# Patient Record
Sex: Female | Born: 1950
Health system: Southern US, Community
[De-identification: ages and names within clinical notes are randomized; demographics above are authoritative.]

## PROBLEM LIST (undated history)

## (undated) DIAGNOSIS — M8580 Other specified disorders of bone density and structure, unspecified site: Secondary | ICD-10-CM

## (undated) DIAGNOSIS — R011 Cardiac murmur, unspecified: Secondary | ICD-10-CM

## (undated) HISTORY — PX: CARDIAC SURGERY: SHX584

## (undated) HISTORY — PX: ANKLE SURGERY: SHX546

## (undated) HISTORY — PX: CHOLECYSTECTOMY: SHX55

## (undated) HISTORY — DX: Other specified disorders of bone density and structure, unspecified site: M85.80

---

## 2000-01-30 ENCOUNTER — Encounter: Admission: RE | Admit: 2000-01-30 | Discharge: 2000-04-29 | Payer: Self-pay | Admitting: Internal Medicine

## 2000-07-16 ENCOUNTER — Other Ambulatory Visit: Admission: RE | Admit: 2000-07-16 | Discharge: 2000-07-16 | Payer: Self-pay | Admitting: Internal Medicine

## 2000-07-30 ENCOUNTER — Encounter: Payer: Self-pay | Admitting: Internal Medicine

## 2000-07-30 ENCOUNTER — Encounter: Admission: RE | Admit: 2000-07-30 | Discharge: 2000-07-30 | Payer: Self-pay | Admitting: Internal Medicine

## 2001-08-07 ENCOUNTER — Other Ambulatory Visit: Admission: RE | Admit: 2001-08-07 | Discharge: 2001-08-07 | Payer: Self-pay | Admitting: Internal Medicine

## 2004-10-24 ENCOUNTER — Other Ambulatory Visit: Admission: RE | Admit: 2004-10-24 | Discharge: 2004-10-24 | Payer: Self-pay | Admitting: Internal Medicine

## 2004-12-27 ENCOUNTER — Encounter (INDEPENDENT_AMBULATORY_CARE_PROVIDER_SITE_OTHER): Payer: Self-pay | Admitting: Specialist

## 2004-12-27 ENCOUNTER — Ambulatory Visit (HOSPITAL_COMMUNITY): Admission: RE | Admit: 2004-12-27 | Discharge: 2004-12-27 | Payer: Self-pay | Admitting: *Deleted

## 2006-01-29 ENCOUNTER — Other Ambulatory Visit: Admission: RE | Admit: 2006-01-29 | Discharge: 2006-01-29 | Payer: Self-pay | Admitting: Internal Medicine

## 2007-04-10 ENCOUNTER — Other Ambulatory Visit: Admission: RE | Admit: 2007-04-10 | Discharge: 2007-04-10 | Payer: Self-pay | Admitting: Internal Medicine

## 2008-04-23 ENCOUNTER — Other Ambulatory Visit: Admission: RE | Admit: 2008-04-23 | Discharge: 2008-04-23 | Payer: Self-pay | Admitting: Internal Medicine

## 2009-06-20 ENCOUNTER — Other Ambulatory Visit: Admission: RE | Admit: 2009-06-20 | Discharge: 2009-06-20 | Payer: Self-pay | Admitting: Internal Medicine

## 2010-06-12 ENCOUNTER — Other Ambulatory Visit: Admission: RE | Admit: 2010-06-12 | Discharge: 2010-06-12 | Payer: Self-pay | Admitting: Internal Medicine

## 2011-05-11 NOTE — Op Note (Signed)
Karla Mullen, Karla Mullen                 ACCOUNT NO.:  [id number]   MEDICAL RECORD NO.:  [id number]          PATIENT TYPE:  AMB   LOCATION:  ENDO                         FACILITY:  [facility]   PHYSICIAN:  Georgiana Spinner, M.D.    DATE OF BIRTH:  [birth year]   DATE OF PROCEDURE:  DATE OF DISCHARGE:                                 OPERATIVE REPORT   PROCEDURE:  Upper endoscopy.   INDICATIONS:  GERD.   ANESTHESIA:  Demerol 50, versed 5 mg.   PROCEDURE:  With the patient mildly sedated and in the left lateral  decubitus position, the Olympus videoscopic endoscope was inserted into the  mouth and passed under direct vision through the esophagus, which appeared  normal except there was a questionable area of Barrett's photographed and  subsequently biopsied.  We entered the stomach.  The fundus, body, antrum,  duodenal bulb, and second portion of the duodenum were visualized.  From  this point, the endoscope was slowly withdrawn, taking circumferential views  of the duodenal mucosa until the endoscope had been pulled back into the  stomach.  It was placed in retroflexion to view the stomach from below.  The  endoscope was straightened and withdrawn, taking circumferential views of  the gastric and esophageal mucosa.  The patient's vital signs and pulse  oximetry remained stable.  The patient tolerated the procedure well without  apparent complications.   FINDINGS:  Question of Barrett's esophagus, biopsied.   RECOMMENDATIONS:  Await biopsy report.  The patient will call me for results  and follow up with me as an outpatient.  Proceed to colonoscopy as planned.      GMO/MEDQ  D:  12/27/2004  T:  12/27/2004  Job:  [id number]

## 2011-05-11 NOTE — Op Note (Signed)
Karla Mullen, CONSUEGRA                 ACCOUNT NO.:  [id number]   MEDICAL RECORD NO.:  [id number]          PATIENT TYPE:  AMB   LOCATION:  ENDO                         FACILITY:  [facility]   PHYSICIAN:  Georgiana Spinner, M.D.    DATE OF BIRTH:  09-06-1951   DATE OF PROCEDURE:  12/27/2004  DATE OF DISCHARGE:                                 OPERATIVE REPORT   PROCEDURE:  Colonoscopy.   INDICATIONS:  Colon cancer screening.   ANESTHESIA:  Demerol 10 mg, Versed 2 mg.   PROCEDURE:  With the patient mildly sedated in the left lateral decubitus  position, the Olympus videoscopic colonoscope was inserted in the rectum and  passed under direct vision.  With pressure applied to the abdomen and the  patient rolled into various positions, we were able to reach the cecum,  identified by ileocecal valve and base of cecum, both of which were  photographed.  From this point the colonoscope was slowly withdrawn, taking  circumferential views of the colonic mucosa, stopping only in the rectum,  which appeared normal on direct and retroflex view.  The endoscope was  straightened and withdrawn.  The patient's vital signs and pulse oximetry  remained stable.  The patient tolerated the procedure well without apparent  complications.   FINDINGS:  A rather unremarkable examination.   PLAN:  See endoscopy note with further details.      GMO/MEDQ  D:  12/27/2004  T:  12/27/2004  Job:  [id number]

## 2012-04-02 ENCOUNTER — Emergency Department (HOSPITAL_COMMUNITY)
Admission: EM | Admit: 2012-04-02 | Discharge: 2012-04-03 | Disposition: A | Discharge status: Home / Self Care | Payer: BC Managed Care – PPO | Attending: Emergency Medicine | Admitting: Emergency Medicine

## 2012-04-02 ENCOUNTER — Encounter (HOSPITAL_COMMUNITY): Payer: Self-pay | Admitting: *Deleted

## 2012-04-02 DIAGNOSIS — R011 Cardiac murmur, unspecified: Secondary | ICD-10-CM | POA: Insufficient documentation

## 2012-04-02 DIAGNOSIS — E119 Type 2 diabetes mellitus without complications: Secondary | ICD-10-CM | POA: Insufficient documentation

## 2012-04-02 DIAGNOSIS — B070 Plantar wart: Secondary | ICD-10-CM | POA: Insufficient documentation

## 2012-04-02 HISTORY — DX: Cardiac murmur, unspecified: R01.1

## 2012-04-02 NOTE — ED Notes (Signed)
Pt reports having an ulcer on her (R) foot.  Cannot be seen podiatric on the 17th but it unable to tolerate the pain.  Pt noted to have an ulcer that is not weeping, on the bottom of her (R) foot.  Pt reports increased pain with ambulation.

## 2012-04-03 MED ORDER — KETOROLAC TROMETHAMINE 60 MG/2ML IM SOLN
60.0000 mg | Freq: Once | INTRAMUSCULAR | Status: AC
  Administered 2012-04-03: 60 mg via INTRAMUSCULAR
  Filled 2012-04-03: qty 2

## 2012-04-03 MED ORDER — NAPROXEN 500 MG PO TABS: 500.0000 mg | ORAL_TABLET | Freq: Two times a day (BID) | ORAL | Status: AC

## 2012-04-03 MED ORDER — TRAMADOL HCL 50 MG PO TABS: 50.0000 mg | ORAL_TABLET | Freq: Four times a day (QID) | ORAL | Status: AC | PRN

## 2012-04-03 NOTE — ED Notes (Signed)
Pt ambulated with a steady gait; VSS; A&Ox3; no signs of distress; respirations even and unlabored; no questions at this time.  

## 2012-04-03 NOTE — ED Provider Notes (Signed)
History     CSN: [id number]  Arrival date & time 04/02/12  2059   First MD Initiated Contact with Patient 04/03/12 0120      Chief Complaint  Patient presents with  . Foot Ulcer    (Consider location/radiation/quality/duration/timing/severity/associated sxs/prior treatment) HPI Comments: 61 year old female with a history of diabetes and prior foot ulcer presents with a lesion on the bottom of her right foot has been present for approximately 2 weeks. It is gradually getting worse, tender to walk and not associated with fevers open wounds or draining.  She has not seen a physician or a podiatrist but does have an appointment in 6 days with a podiatrist.  The history is provided by the patient.    Past Medical History  Diagnosis Date  . Diabetes mellitus   . Heart murmur     Past Surgical History  Procedure Date  . Cardiac surgery     had at 61 years old  . Cholecystectomy     History reviewed. No pertinent family history.  History  Substance Use Topics  . Smoking status: Never Smoker   . Smokeless tobacco: Not on file  . Alcohol Use: No    OB History    Grav Para Term Preterm Abortions TAB SAB Ect Mult Living                  Review of Systems  Constitutional: Negative for fever.  Skin:       Lesion to the bottom of the right foot    Allergies  Review of patient's allergies indicates no known allergies.  Home Medications   Current Outpatient Rx  Name Route Sig Dispense Refill  . ASPIRIN EC 81 MG PO TBEC Oral Take 81 mg by mouth daily.    . IBUPROFEN 200 MG PO TABS Oral Take 400 mg by mouth every 6 (six) hours as needed. For pain    . METFORMIN HCL 500 MG PO TABS Oral Take 1,000 mg by mouth 2 (two) times daily with a meal.    . ADULT MULTIVITAMIN W/MINERALS CH Oral Take 1 tablet by mouth daily.    Marland Kitchen NAPROXEN 500 MG PO TABS Oral Take 1 tablet (500 mg total) by mouth 2 (two) times daily with a meal. 30 tablet 0  . TRAMADOL HCL 50 MG PO TABS Oral Take 1  tablet (50 mg total) by mouth every 6 (six) hours as needed for pain. 15 tablet 0    BP 122/64  Pulse 60  Temp(Src) 98.5 F (36.9 C) (Oral)  Resp 18  SpO2 99%  Physical Exam  Nursing note and vitals reviewed. Constitutional: She appears well-developed and well-nourished. No distress.  HENT:  Head: Normocephalic and atraumatic.  Eyes: Conjunctivae are normal. No scleral icterus.  Cardiovascular: Normal rate, regular rhythm and intact distal pulses.   Pulmonary/Chest: Effort normal and breath sounds normal.  Musculoskeletal: She exhibits tenderness ( On the bottom of the right foot). She exhibits no edema.  Neurological: She is alert.  Skin: Skin is warm and dry. No rash noted. She is not diaphoretic.       Thickened skin on the bottom of the right foot, mild tenderness to palpation this is over the ball of the foot. It appears to be consistent with a thickened callus or plantars wart.  There is no bleeding or drainage and no open wound    ED Course  Procedures (including critical care time)  Labs Reviewed - No data to display No  results found.   1. Plantar wart       MDM  Well appearing, likely plantars wart versus a callus to the bottom of the foot. Can followup with podiatry safely, intramuscular Toradol given prior to discharge        Vida Roller, MD 04/03/12 0130

## 2012-04-03 NOTE — ED Notes (Signed)
MD at bedside. 

## 2012-04-03 NOTE — Discharge Instructions (Signed)
Plantar Warts Plantar warts are growths on the bottom of your foot. Warts are caused by a germ.  HOME CARE  Soak your foot in warm water. Dry your foot when you are done. Remove the top layer of softened skin, then apply any medicine as told by your doctor.   Remove any bandages daily. File off extra wart tissue. Repeat this as told by your doctor until the wart goes away.   Only use medicine as told by your doctor.   Use a bandage with a hole in it (doughnut bandage) to relieve pain.Put the hole over the wart.   Wear shoes and socks and change them daily.   Keep your foot clean and dry.   Check your feet regularly.   Avoid contact with warts on other people.   Have your warts checked by your doctor.  GET HELP RIGHT AWAY IF: The treated skin becomes red, puffy (swollen), or painful. MAKE SURE YOU:  Understand these instructions.   Will watch your condition.   Will get help right away if you are not doing well or get worse.  Document Released: 01/12/2011 Document Revised: 11/29/2011 Document Reviewed: 01/12/2011 [employer] Patient Information 2012 [employer]. 

## 2013-07-30 ENCOUNTER — Telehealth (HOSPITAL_COMMUNITY): Payer: Self-pay | Admitting: Internal Medicine

## 2013-08-04 ENCOUNTER — Telehealth (HOSPITAL_COMMUNITY): Payer: Self-pay | Admitting: Internal Medicine

## 2013-08-04 NOTE — Telephone Encounter (Signed)
Left message for patient to call back/needs to schedule testing ordered by Dr. Selena Batten

## 2013-08-12 ENCOUNTER — Telehealth (HOSPITAL_COMMUNITY): Payer: Self-pay | Admitting: Internal Medicine

## 2013-08-17 ENCOUNTER — Telehealth (HOSPITAL_COMMUNITY): Payer: Self-pay | Admitting: Internal Medicine

## 2013-08-17 NOTE — Telephone Encounter (Signed)
Sent letter to ordering physician regarding testing not being scheduled due to patient not responding to phone calls or letters. 

## 2013-09-20 ENCOUNTER — Emergency Department (HOSPITAL_COMMUNITY): Payer: Worker's Compensation

## 2013-09-20 ENCOUNTER — Emergency Department (HOSPITAL_COMMUNITY)
Admission: EM | Admit: 2013-09-20 | Discharge: 2013-09-20 | Disposition: A | Discharge status: Home / Self Care | Payer: Worker's Compensation | Attending: Emergency Medicine | Admitting: Emergency Medicine

## 2013-09-20 DIAGNOSIS — R209 Unspecified disturbances of skin sensation: Secondary | ICD-10-CM | POA: Insufficient documentation

## 2013-09-20 DIAGNOSIS — Y9389 Activity, other specified: Secondary | ICD-10-CM | POA: Insufficient documentation

## 2013-09-20 DIAGNOSIS — S52599A Other fractures of lower end of unspecified radius, initial encounter for closed fracture: Secondary | ICD-10-CM | POA: Insufficient documentation

## 2013-09-20 DIAGNOSIS — Y9289 Other specified places as the place of occurrence of the external cause: Secondary | ICD-10-CM | POA: Insufficient documentation

## 2013-09-20 DIAGNOSIS — R011 Cardiac murmur, unspecified: Secondary | ICD-10-CM | POA: Insufficient documentation

## 2013-09-20 DIAGNOSIS — Z79899 Other long term (current) drug therapy: Secondary | ICD-10-CM | POA: Insufficient documentation

## 2013-09-20 DIAGNOSIS — E119 Type 2 diabetes mellitus without complications: Secondary | ICD-10-CM | POA: Insufficient documentation

## 2013-09-20 DIAGNOSIS — W010XXA Fall on same level from slipping, tripping and stumbling without subsequent striking against object, initial encounter: Secondary | ICD-10-CM | POA: Insufficient documentation

## 2013-09-20 DIAGNOSIS — Y990 Civilian activity done for income or pay: Secondary | ICD-10-CM | POA: Insufficient documentation

## 2013-09-20 DIAGNOSIS — Z7982 Long term (current) use of aspirin: Secondary | ICD-10-CM | POA: Insufficient documentation

## 2013-09-20 DIAGNOSIS — S52501A Unspecified fracture of the lower end of right radius, initial encounter for closed fracture: Secondary | ICD-10-CM

## 2013-09-20 MED ORDER — IBUPROFEN 800 MG PO TABS
800.0000 mg | ORAL_TABLET | Freq: Once | ORAL | Status: AC
  Administered 2013-09-20: 800 mg via ORAL
  Filled 2013-09-20: qty 1

## 2013-09-20 MED ORDER — HYDROCODONE-ACETAMINOPHEN 5-325 MG PO TABS: 1.0000 | ORAL_TABLET | Freq: Four times a day (QID) | ORAL | Status: DC | PRN

## 2013-09-20 NOTE — ED Notes (Signed)
Pt states that she slipped where a piece of meat had been dropped on the floor at work and landed on her bottom and her R hand. Now having pain in R hand. No LOC. Did not hit head.

## 2013-09-20 NOTE — ED Provider Notes (Signed)
CSN: [id number]     Arrival date & time 09/20/13  1606 History  This chart was scribed for non-physician practitioner, Johnnette Gourd, PA-C working with Nelia Shi, MD by Greggory Stallion, ED scribe. This patient was seen in room WTR5/WTR5 and the patient's care was started at 4:18 PM.   Chief Complaint  Patient presents with  . Hand Injury  . Fall   The history is provided by the patient. No language interpreter was used.    HPI Comments: Karla Mullen is a 62 y.o. female who presents to the Emergency Department complaining of right wrist injury that occurred about 3 hours ago. She now has sudden onset right wrist pain with associated numbness and swelling. Pt rates her pain 7/10. She has been using ice with little relief. Pt denies hitting her head or LOC. She denies any other associated symptoms.    Past Medical History  Diagnosis Date  . Diabetes mellitus   . Heart murmur    Past Surgical History  Procedure Laterality Date  . Cardiac surgery      had at 62 years old  . Cholecystectomy     No family history on file. History  Substance Use Topics  . Smoking status: Never Smoker   . Smokeless tobacco: Not on file  . Alcohol Use: No   OB History   Grav Para Term Preterm Abortions TAB SAB Ect Mult Living                 Review of Systems  Musculoskeletal: Positive for joint swelling and arthralgias.  Neurological: Positive for numbness. Negative for syncope.  All other systems reviewed and are negative.    Allergies  Review of patient's allergies indicates no known allergies.  Home Medications   Current Outpatient Rx  Name  Route  Sig  Dispense  Refill  . aspirin EC 81 MG tablet   Oral   Take 81 mg by mouth daily.         Marland Kitchen ibuprofen (ADVIL,MOTRIN) 200 MG tablet   Oral   Take 400 mg by mouth every 6 (six) hours as needed. For pain         . metFORMIN (GLUCOPHAGE) 500 MG tablet   Oral   Take 1,000 mg by mouth 2 (two) times daily with a meal.         .  Multiple Vitamin (MULITIVITAMIN WITH MINERALS) TABS   Oral   Take 1 tablet by mouth daily.          BP 177/80  Pulse 67  Temp(Src) 99.1 F (37.3 C) (Oral)  SpO2 100%  Physical Exam  Nursing note and vitals reviewed. Constitutional: She is oriented to person, place, and time. She appears well-developed and well-nourished. No distress.  HENT:  Head: Normocephalic and atraumatic.  Mouth/Throat: Oropharynx is clear and moist.  Eyes: Conjunctivae and EOM are normal.  Neck: Normal range of motion. Neck supple.  Cardiovascular: Normal rate, regular rhythm and normal heart sounds.   Pulmonary/Chest: Effort normal and breath sounds normal. No respiratory distress.  Musculoskeletal: Normal range of motion. She exhibits no edema.  Swelling and tenderness over her carpal bones, more so on the radial aspect of her right wrist. Full ROM of her hand. Limited ROM of her wrist due to pain. Capillary refill less than 3 seconds. Strength intact.   Neurological: She is alert and oriented to person, place, and time. No sensory deficit.  Sensation intact.   Skin: Skin is warm and  dry.  Psychiatric: She has a normal mood and affect. Her behavior is normal.    ED Course  Procedures (including critical care time)  DIAGNOSTIC STUDIES: Oxygen Saturation is 100% on RA, normal by my interpretation.    COORDINATION OF CARE: 4:20 PM-Discussed treatment plan which includes xray with pt at bedside and pt agreed to plan.   Labs Review Labs Reviewed - No data to display Imaging Review Dg Wrist Complete Right  09/20/2013   CLINICAL DATA:  Fall. Right wrist pain.  EXAM: RIGHT WRIST - COMPLETE 3+ VIEW  COMPARISON:  None.  FINDINGS: Transverse nondisplaced distal radial metaphysis heel fracture. Loss of the normal volar tilt. Distal in a appears intact. Scaphoid bone appears normal. Extension into the distal radioulnar joint. No extension into the radiocarpal joint.  IMPRESSION: Transverse nondisplaced distal  radius fracture with loss of the normal volar tilt.   Electronically Signed   By: Andreas Newport M.D.   On: 09/20/2013 16:49    MDM   1. Distal radius fracture, right, closed, initial encounter    Patient with nondisplaced distal radius fracture. Neurovascularly intact. Splint applied. She has seen [facility]opedics in the past, will followup with [facility] orthopedics. Vicodin given for severe pain. Refuses pain medication initially, at discharge requests ibuprofen. Return precautions discussed. Patient states understanding of plan and is agreeable.   I personally performed the services described in this documentation, which was scribed in my presence. The recorded information has been reviewed and is accurate.   LORALI KHAMIS, PA-C 09/20/13 1745  Trevor Mace, PA-C 09/20/13 [phone]

## 2013-09-20 NOTE — ED Notes (Signed)
Drug urine complete- pt can be discharged

## 2013-10-02 NOTE — ED Provider Notes (Signed)
Medical screening examination/treatment/procedure(s) were performed by non-physician practitioner and as supervising physician I was immediately available for consultation/collaboration.    Shlok Raz L Chadd Tollison, MD 10/02/13 0748 

## 2017-12-11 DIAGNOSIS — Z0000 Encounter for general adult medical examination without abnormal findings: Secondary | ICD-10-CM | POA: Diagnosis not present

## 2017-12-11 DIAGNOSIS — R011 Cardiac murmur, unspecified: Secondary | ICD-10-CM | POA: Diagnosis not present

## 2017-12-11 DIAGNOSIS — R05 Cough: Secondary | ICD-10-CM | POA: Diagnosis not present

## 2017-12-24 HISTORY — PX: BREAST BIOPSY: SHX20

## 2018-01-26 DIAGNOSIS — R011 Cardiac murmur, unspecified: Secondary | ICD-10-CM | POA: Insufficient documentation

## 2018-01-26 NOTE — Progress Notes (Deleted)
Cardiology Office Note    Date:  01/26/2018   ID:  Karla Mullen, DOB Jun 07, 1951, MRN [id number]  PCP:  Jani Gravel, MD  Cardiologist: Sinclair Grooms, MD   No chief complaint on file.   History of Present Illness:  Karla Mullen is a 67 y.o. female  Referred by Dr. Jani Gravel for evaluation of heart murmur.    Past Medical History:  Diagnosis Date  . Diabetes mellitus   . Heart murmur     Past Surgical History:  Procedure Laterality Date  . CARDIAC SURGERY     had at 67 years old  . CHOLECYSTECTOMY      Current Medications: Outpatient Medications Prior to Visit  Medication Sig Dispense Refill  . aspirin EC 81 MG tablet Take 81 mg by mouth daily.    Marland Kitchen HYDROcodone-acetaminophen (NORCO/VICODIN) 5-325 MG per tablet Take 1-2 tablets by mouth every 6 (six) hours as needed for pain. 15 tablet 0  . ibuprofen (ADVIL,MOTRIN) 200 MG tablet Take 400 mg by mouth every 6 (six) hours as needed. For pain    . metFORMIN (GLUCOPHAGE) 500 MG tablet Take 1,000 mg by mouth 2 (two) times daily with a meal.    . Multiple Vitamin (MULITIVITAMIN WITH MINERALS) TABS Take 1 tablet by mouth daily.     No facility-administered medications prior to visit.      Allergies:   Patient has no known allergies.   Social History   Socioeconomic History  . Marital status: Single    Spouse name: Not on file  . Number of children: Not on file  . Years of education: Not on file  . Highest education level: Not on file  Social Needs  . Financial resource strain: Not on file  . Food insecurity - worry: Not on file  . Food insecurity - inability: Not on file  . Transportation needs - medical: Not on file  . Transportation needs - non-medical: Not on file  Occupational History  . Not on file  Tobacco Use  . Smoking status: Never Smoker  Substance and Sexual Activity  . Alcohol use: No  . Drug use: No  . Sexual activity: Not on file  Other Topics Concern  . Not on file  Social History Narrative  .  Not on file     Family History:  The patient's ***family history is not on file.   ROS:   Please see the history of present illness.    ***  All other systems reviewed and are negative.   PHYSICAL EXAM:   VS:  There were no vitals taken for this visit.   GEN: Well nourished, well developed, in no acute distress  HEENT: normal  Neck: no JVD, carotid bruits, or masses Cardiac: ***RRR; no murmurs, rubs, or gallops,no edema  Respiratory:  clear to auscultation bilaterally, normal work of breathing GI: soft, nontender, nondistended, + BS MS: no deformity or atrophy  Skin: warm and dry, no rash Neuro:  Alert and Oriented x 3, Strength and sensation are intact Psych: euthymic mood, full affect  Wt Readings from Last 3 Encounters:  No data found for Wt      Studies/Labs Reviewed:   EKG:  EKG  ***  Recent Labs: No results found for requested labs within last 8760 hours.   Lipid Panel No results found for: CHOL, TRIG, HDL, CHOLHDL, VLDL, LDLCALC, LDLDIRECT  Additional studies/ records that were reviewed today include:  ***    ASSESSMENT:  1. Heart murmur      PLAN:  In order of problems listed above:  1. ***    Medication Adjustments/Labs and Tests Ordered: Current medicines are reviewed at length with the patient today.  Concerns regarding medicines are outlined above.  Medication changes, Labs and Tests ordered today are listed in the Patient Instructions below. There are no Patient Instructions on file for this visit.   Signed, Sinclair Grooms, MD  01/26/2018 8:40 AM    [facility] [address], [place], [place]  [address] Phone: [phone]; Fax: [phone]

## 2018-01-27 ENCOUNTER — Ambulatory Visit: Payer: Self-pay | Admitting: Interventional Cardiology

## 2018-02-11 ENCOUNTER — Other Ambulatory Visit: Payer: Self-pay | Admitting: Internal Medicine

## 2018-02-11 DIAGNOSIS — Z1231 Encounter for screening mammogram for malignant neoplasm of breast: Secondary | ICD-10-CM

## 2018-02-17 DIAGNOSIS — E7800 Pure hypercholesterolemia, unspecified: Secondary | ICD-10-CM | POA: Diagnosis not present

## 2018-02-17 DIAGNOSIS — I10 Essential (primary) hypertension: Secondary | ICD-10-CM | POA: Diagnosis not present

## 2018-02-17 DIAGNOSIS — N390 Urinary tract infection, site not specified: Secondary | ICD-10-CM | POA: Diagnosis not present

## 2018-02-17 DIAGNOSIS — E559 Vitamin D deficiency, unspecified: Secondary | ICD-10-CM | POA: Diagnosis not present

## 2018-02-18 ENCOUNTER — Ambulatory Visit
Admission: RE | Admit: 2018-02-18 | Discharge: 2018-02-18 | Disposition: A | Discharge status: Home / Self Care | Payer: Medicare Other | Source: Ambulatory Visit | Attending: Internal Medicine | Admitting: Internal Medicine

## 2018-02-18 DIAGNOSIS — Z1231 Encounter for screening mammogram for malignant neoplasm of breast: Secondary | ICD-10-CM | POA: Diagnosis not present

## 2018-02-20 ENCOUNTER — Other Ambulatory Visit: Payer: Self-pay | Admitting: Internal Medicine

## 2018-02-20 DIAGNOSIS — R928 Other abnormal and inconclusive findings on diagnostic imaging of breast: Secondary | ICD-10-CM

## 2018-02-21 DIAGNOSIS — M8580 Other specified disorders of bone density and structure, unspecified site: Secondary | ICD-10-CM

## 2018-02-21 HISTORY — DX: Other specified disorders of bone density and structure, unspecified site: M85.80

## 2018-02-24 ENCOUNTER — Ambulatory Visit (INDEPENDENT_AMBULATORY_CARE_PROVIDER_SITE_OTHER): Payer: Medicare Other | Admitting: Obstetrics & Gynecology

## 2018-02-24 ENCOUNTER — Encounter: Payer: Self-pay | Admitting: Obstetrics & Gynecology

## 2018-02-24 VITALS — BP 150/92 | Ht 67.0 in | Wt 217.0 lb

## 2018-02-24 DIAGNOSIS — E6609 Other obesity due to excess calories: Secondary | ICD-10-CM

## 2018-02-24 DIAGNOSIS — Z780 Asymptomatic menopausal state: Secondary | ICD-10-CM

## 2018-02-24 DIAGNOSIS — Z124 Encounter for screening for malignant neoplasm of cervix: Secondary | ICD-10-CM

## 2018-02-24 DIAGNOSIS — Z6833 Body mass index (BMI) 33.0-33.9, adult: Secondary | ICD-10-CM

## 2018-02-24 DIAGNOSIS — Z13820 Encounter for screening for osteoporosis: Secondary | ICD-10-CM

## 2018-02-24 DIAGNOSIS — Z01419 Encounter for gynecological examination (general) (routine) without abnormal findings: Secondary | ICD-10-CM

## 2018-02-24 NOTE — Patient Instructions (Signed)
1. Encounter for routine gynecological examination with Papanicolaou smear of cervix Normal gynecologic exam and menopause.  Reflex done.  Breast exam normal.  Recent screening mammogram abnormal on the left side, diagnostic left mammogram and ultrasound scheduled.  Scheduled to see Dr. Maudie Mercury shortly, just did her health labs.  Will organize colonoscopy with Dr. Maudie Mercury.  2. Menopause present Well on no hormone replacement therapy.  No postmenopausal bleeding.  Abstinent.  3. Screening for osteoporosis Vitamin D supplements, calcium rich nutrition and regular weightbearing physical activity recommended.  Will follow up here for bone density. - DG Bone Density; Future  4. Class 1 obesity due to excess calories without serious comorbidity with body mass index (BMI) of 33.0 to 33.9 in adult Low calorie/low carb diet reviewed and [employer] recommended.  Regular aerobic activity 5 times a week and weightlifting every 2 days recommended.  5. Screening for malignant neoplasm of cervix  - Pap IG w/ reflex to HPV when [facility]  Karla Mullen, it was a pleasure meeting you today!  I will inform you of your results as soon as they are available.   Health Maintenance for Postmenopausal Women Menopause is a normal process in which your reproductive ability comes to an end. This process happens gradually over a span of months to years, usually between the ages of 31 and 36. Menopause is complete when you have missed 12 consecutive menstrual periods. It is important to talk with your health care provider about some of the most common conditions that affect postmenopausal women, such as heart disease, cancer, and bone loss (osteoporosis). Adopting a healthy lifestyle and getting preventive care can help to promote your health and wellness. Those actions can also lower your chances of developing some of these common conditions. What should I know about menopause? During menopause, you may experience a number of  symptoms, such as:  Moderate-to-severe hot flashes.  Night sweats.  Decrease in sex drive.  Mood swings.  Headaches.  Tiredness.  Irritability.  Memory problems.  Insomnia.  Choosing to treat or not to treat menopausal changes is an individual decision that you make with your health care provider. What should I know about hormone replacement therapy and supplements? Hormone therapy products are effective for treating symptoms that are associated with menopause, such as hot flashes and night sweats. Hormone replacement carries certain risks, especially as you become older. If you are thinking about using estrogen or estrogen with progestin treatments, discuss the benefits and risks with your health care provider. What should I know about heart disease and stroke? Heart disease, heart attack, and stroke become more likely as you age. This may be due, in part, to the hormonal changes that your body experiences during menopause. These can affect how your body processes dietary fats, triglycerides, and cholesterol. Heart attack and stroke are both medical emergencies. There are many things that you can do to help prevent heart disease and stroke:  Have your blood pressure checked at least every 1-2 years. High blood pressure causes heart disease and increases the risk of stroke.  If you are 38-8 years old, ask your health care provider if you should take aspirin to prevent a heart attack or a stroke.  Do not use any tobacco products, including cigarettes, chewing tobacco, or electronic cigarettes. If you need help quitting, ask your health care provider.  It is important to eat a healthy diet and maintain a healthy weight. ? Be sure to include plenty of vegetables, fruits, low-fat dairy products,  and lean protein. ? Avoid eating foods that are high in solid fats, added sugars, or salt (sodium).  Get regular exercise. This is one of the most important things that you can do for your  health. ? Try to exercise for at least 150 minutes each week. The type of exercise that you do should increase your heart rate and make you sweat. This is known as moderate-intensity exercise. ? Try to do strengthening exercises at least twice each week. Do these in addition to the moderate-intensity exercise.  Know your numbers.Ask your health care provider to check your cholesterol and your blood glucose. Continue to have your blood tested as directed by your health care provider.  What should I know about cancer screening? There are several types of cancer. Take the following steps to reduce your risk and to catch any cancer development as early as possible. Breast Cancer  Practice breast self-awareness. ? This means understanding how your breasts normally appear and feel. ? It also means doing regular breast self-exams. Let your health care provider know about any changes, no matter how small.  If you are 73 or older, have a clinician do a breast exam (clinical breast exam or CBE) every year. Depending on your age, family history, and medical history, it may be recommended that you also have a yearly breast X-ray (mammogram).  If you have a family history of breast cancer, talk with your health care provider about genetic screening.  If you are at high risk for breast cancer, talk with your health care provider about having an MRI and a mammogram every year.  Breast cancer (BRCA) gene test is recommended for women who have family members with BRCA-related cancers. Results of the assessment will determine the need for genetic counseling and BRCA1 and for BRCA2 testing. BRCA-related cancers include these types: ? Breast. This occurs in males or females. ? Ovarian. ? Tubal. This may also be called fallopian tube cancer. ? Cancer of the abdominal or pelvic lining (peritoneal cancer). ? Prostate. ? Pancreatic.  Cervical, Uterine, and Ovarian Cancer Your health care provider may recommend  that you be screened regularly for cancer of the pelvic organs. These include your ovaries, uterus, and vagina. This screening involves a pelvic exam, which includes checking for microscopic changes to the surface of your cervix (Pap test).  For women ages 21-65, health care providers may recommend a pelvic exam and a Pap test every three years. For women ages 43-65, they may recommend the Pap test and pelvic exam, combined with testing for human papilloma virus (HPV), every five years. Some types of HPV increase your risk of cervical cancer. Testing for HPV may also be done on women of any age who have unclear Pap test results.  Other health care providers may not recommend any screening for nonpregnant women who are considered low risk for pelvic cancer and have no symptoms. Ask your health care provider if a screening pelvic exam is right for you.  If you have had past treatment for cervical cancer or a condition that could lead to cancer, you need Pap tests and screening for cancer for at least 20 years after your treatment. If Pap tests have been discontinued for you, your risk factors (such as having a new sexual partner) need to be reassessed to determine if you should start having screenings again. Some women have medical problems that increase the chance of getting cervical cancer. In these cases, your health care provider may recommend that you  have screening and Pap tests more often.  If you have a family history of uterine cancer or ovarian cancer, talk with your health care provider about genetic screening.  If you have vaginal bleeding after reaching menopause, tell your health care provider.  There are currently no reliable tests available to screen for ovarian cancer.  Lung Cancer Lung cancer screening is recommended for adults 28-11 years old who are at high risk for lung cancer because of a history of smoking. A yearly low-dose CT scan of the lungs is recommended if you:  Currently  smoke.  Have a history of at least 30 pack-years of smoking and you currently smoke or have quit within the past 15 years. A pack-year is smoking an average of one pack of cigarettes per day for one year.  Yearly screening should:  Continue until it has been 15 years since you quit.  Stop if you develop a health problem that would prevent you from having lung cancer treatment.  Colorectal Cancer  This type of cancer can be detected and can often be prevented.  Routine colorectal cancer screening usually begins at age 49 and continues through age 52.  If you have risk factors for colon cancer, your health care provider may recommend that you be screened at an earlier age.  If you have a family history of colorectal cancer, talk with your health care provider about genetic screening.  Your health care provider may also recommend using home test kits to check for hidden blood in your stool.  A small camera at the end of a tube can be used to examine your colon directly (sigmoidoscopy or colonoscopy). This is done to check for the earliest forms of colorectal cancer.  Direct examination of the colon should be repeated every 5-10 years until age 17. However, if early forms of precancerous polyps or small growths are found or if you have a family history or genetic risk for colorectal cancer, you may need to be screened more often.  Skin Cancer  Check your skin from head to toe regularly.  Monitor any moles. Be sure to tell your health care provider: ? About any new moles or changes in moles, especially if there is a change in a mole's shape or color. ? If you have a mole that is larger than the size of a pencil eraser.  If any of your family members has a history of skin cancer, especially at a young age, talk with your health care provider about genetic screening.  Always use sunscreen. Apply sunscreen liberally and repeatedly throughout the day.  Whenever you are outside, protect  yourself by wearing long sleeves, pants, a wide-brimmed hat, and sunglasses.  What should I know about osteoporosis? Osteoporosis is a condition in which bone destruction happens more quickly than new bone creation. After menopause, you may be at an increased risk for osteoporosis. To help prevent osteoporosis or the bone fractures that can happen because of osteoporosis, the following is recommended:  If you are 33-65 years old, get at least 1,000 mg of calcium and at least 600 mg of vitamin D per day.  If you are older than age 56 but younger than age 26, get at least 1,200 mg of calcium and at least 600 mg of vitamin D per day.  If you are older than age 32, get at least 1,200 mg of calcium and at least 800 mg of vitamin D per day.  Smoking and excessive alcohol intake increase the  risk of osteoporosis. Eat foods that are rich in calcium and vitamin D, and do weight-bearing exercises several times each week as directed by your health care provider. What should I know about how menopause affects my mental health? Depression may occur at any age, but it is more common as you become older. Common symptoms of depression include:  Low or sad mood.  Changes in sleep patterns.  Changes in appetite or eating patterns.  Feeling an overall lack of motivation or enjoyment of activities that you previously enjoyed.  Frequent crying spells.  Talk with your health care provider if you think that you are experiencing depression. What should I know about immunizations? It is important that you get and maintain your immunizations. These include:  Tetanus, diphtheria, and pertussis (Tdap) booster vaccine.  Influenza every year before the flu season begins.  Pneumonia vaccine.  Shingles vaccine.  Your health care provider may also recommend other immunizations. This information is not intended to replace advice given to you by your health care provider. Make sure you discuss any questions you  have with your health care provider. Document Released: 02/01/2006 Document Revised: 06/29/2016 Document Reviewed: 09/13/2015 Elsevier Interactive Patient Education  2018 [employer].

## 2018-02-24 NOTE — Progress Notes (Signed)
Karla Mullen [birth year] [id number]   History:    67 y.o. G0 Single.  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  Has 5 brothers.  RP:  New patient presenting for annual gyn exam   HPI: Menopausal for many years.  No hormone replacement therapy.  No postmenopausal bleeding.  No pelvic pain.  Not sexually active for the past 2 years.  Normal vaginal secretions.  Urine and bowel movements normal.  Breasts normal.  Had a screening mammogram February 2019, will follow up for a diagnostic left mammogram and ultrasound.  Body mass index 33.99, plans going on a low calorie low-carb diet and increasing physical activity.  Visit scheduled with Dr. Maudie Mercury family physician.  Fasting health labs just done.  Will organize colonoscopy through Dr. Maudie Mercury.  Cardio for h/o open heart surgery as an infant.  Has a stable heart murmur, no SOB, no palpitation.  Past medical history,surgical history, family history and social history were all reviewed and documented in the EPIC chart.  Gynecologic History No LMP recorded. Patient is postmenopausal. Contraception: post menopausal status Last Pap: No recent pap, used to do them with Dr Maudie Mercury.  Normal in the past per patient. Last mammogram: 01/2018. Results were: Abnormal Left Mammo, scheduled for a Lt Dx mammo/US Bone Density: Never Colonoscopy: Overdue, will organize with Dr Maudie Mercury  Obstetric History OB History  Gravida Para Term Preterm AB Living  0 0 0 0 0 0  SAB TAB Ectopic Multiple Live Births  0 0 0 0 0         ROS: A ROS was performed and pertinent positives and negatives are included in the history.  GENERAL: No fevers or chills. HEENT: No change in vision, no earache, sore throat or sinus congestion. NECK: No pain or stiffness. CARDIOVASCULAR: No chest pain or pressure. No palpitations. PULMONARY: No shortness of breath, cough or wheeze. GASTROINTESTINAL: No abdominal pain, nausea, vomiting or diarrhea, melena or bright  red blood per rectum. GENITOURINARY: No urinary frequency, urgency, hesitancy or dysuria. MUSCULOSKELETAL: No joint or muscle pain, no back pain, no recent trauma. DERMATOLOGIC: No rash, no itching, no lesions. ENDOCRINE: No polyuria, polydipsia, no heat or cold intolerance. No recent change in weight. HEMATOLOGICAL: No anemia or easy bruising or bleeding. NEUROLOGIC: No headache, seizures, numbness, tingling or weakness. PSYCHIATRIC: No depression, no loss of interest in normal activity or change in sleep pattern.     Exam:   BP (!) 150/92   Ht 5\' 7"  (1.702 m)   Wt 217 lb (98.4 kg)   BMI 33.99 kg/m   Body mass index is 33.99 kg/m.  General appearance : Well developed well nourished female. No acute distress HEENT: Eyes: no retinal hemorrhage or exudates,  Neck supple, trachea midline, no carotid bruits, no thyroidmegaly Lungs: Clear to auscultation, no rhonchi or wheezes, or rib retractions  Heart: Regular rate and rhythm, loud systolic ejection murmur, no gallops Breast:Examined in sitting and supine position were symmetrical in appearance, no palpable masses or tenderness,  no skin retraction, no nipple inversion, no nipple discharge, no skin discoloration, no axillary or supraclavicular lymphadenopathy Abdomen: no palpable masses or tenderness, no rebound or guarding Extremities: no edema or skin discoloration or tenderness  Pelvic: Vulva: Normal             Vagina: No gross lesions or discharge  Cervix: No gross lesions or discharge.  Pap reflex done.  Uterus  RV, normal size, shape and  consistency, non-tender and mobile  Adnexa  Without masses or tenderness  Anus: Normal   Assessment/Plan:  67 y.o. female for annual exam   1. Encounter for routine gynecological examination with Papanicolaou smear of cervix Normal gynecologic exam and menopause.  Reflex done.  Breast exam normal.  Recent screening mammogram abnormal on the left side, diagnostic left mammogram and ultrasound  scheduled.  Scheduled to see Dr. Maudie Mercury shortly, just did her health labs.  Will organize colonoscopy with Dr. Maudie Mercury.  2. Menopause present Well on no hormone replacement therapy.  No postmenopausal bleeding.  Abstinent.  3. Screening for osteoporosis Vitamin D supplements, calcium rich nutrition and regular weightbearing physical activity recommended.  Will follow up here for bone density. - DG Bone Density; Future  4. Class 1 obesity due to excess calories without serious comorbidity with body mass index (BMI) of 33.0 to 33.9 in adult Low calorie/low carb diet reviewed and [employer] recommended.  Regular aerobic activity 5 times a week and weightlifting every 2 days recommended.  5. Screening for malignant neoplasm of cervix  - Pap IG w/ reflex to HPV when [facility]  Counseling on above issues more than 50% for 20 minutes.  [name] MD, 9:25 AM 02/24/2018

## 2018-02-26 ENCOUNTER — Encounter: Payer: Self-pay | Admitting: Interventional Cardiology

## 2018-02-26 LAB — PAP IG W/ RFLX HPV ASCU

## 2018-02-27 ENCOUNTER — Ambulatory Visit: Payer: Self-pay | Admitting: Interventional Cardiology

## 2018-02-27 DIAGNOSIS — M5441 Lumbago with sciatica, right side: Secondary | ICD-10-CM | POA: Diagnosis not present

## 2018-02-27 DIAGNOSIS — D649 Anemia, unspecified: Secondary | ICD-10-CM | POA: Diagnosis not present

## 2018-02-27 DIAGNOSIS — Z0000 Encounter for general adult medical examination without abnormal findings: Secondary | ICD-10-CM | POA: Diagnosis not present

## 2018-02-27 DIAGNOSIS — E119 Type 2 diabetes mellitus without complications: Secondary | ICD-10-CM | POA: Diagnosis not present

## 2018-02-28 ENCOUNTER — Other Ambulatory Visit: Payer: Self-pay | Admitting: Internal Medicine

## 2018-02-28 ENCOUNTER — Ambulatory Visit
Admission: RE | Admit: 2018-02-28 | Discharge: 2018-02-28 | Disposition: A | Discharge status: Home / Self Care | Payer: Medicare Other | Source: Ambulatory Visit | Attending: Internal Medicine | Admitting: Internal Medicine

## 2018-02-28 DIAGNOSIS — R928 Other abnormal and inconclusive findings on diagnostic imaging of breast: Secondary | ICD-10-CM

## 2018-02-28 DIAGNOSIS — N6320 Unspecified lump in the left breast, unspecified quadrant: Secondary | ICD-10-CM | POA: Diagnosis not present

## 2018-03-04 ENCOUNTER — Other Ambulatory Visit: Payer: Self-pay | Admitting: Gynecology

## 2018-03-04 ENCOUNTER — Ambulatory Visit: Payer: Self-pay | Admitting: Interventional Cardiology

## 2018-03-04 ENCOUNTER — Other Ambulatory Visit: Payer: Self-pay | Admitting: Obstetrics & Gynecology

## 2018-03-04 DIAGNOSIS — Z780 Asymptomatic menopausal state: Secondary | ICD-10-CM

## 2018-03-05 ENCOUNTER — Ambulatory Visit
Admission: RE | Admit: 2018-03-05 | Discharge: 2018-03-05 | Disposition: A | Discharge status: Home / Self Care | Payer: Medicare Other | Source: Ambulatory Visit | Attending: Internal Medicine | Admitting: Internal Medicine

## 2018-03-05 ENCOUNTER — Other Ambulatory Visit: Payer: Self-pay | Admitting: Internal Medicine

## 2018-03-05 DIAGNOSIS — R928 Other abnormal and inconclusive findings on diagnostic imaging of breast: Secondary | ICD-10-CM

## 2018-03-05 DIAGNOSIS — N6012 Diffuse cystic mastopathy of left breast: Secondary | ICD-10-CM | POA: Diagnosis not present

## 2018-03-05 DIAGNOSIS — N6324 Unspecified lump in the left breast, lower inner quadrant: Secondary | ICD-10-CM | POA: Diagnosis not present

## 2018-03-05 DIAGNOSIS — N6322 Unspecified lump in the left breast, upper inner quadrant: Secondary | ICD-10-CM | POA: Diagnosis not present

## 2018-03-18 ENCOUNTER — Other Ambulatory Visit: Payer: Self-pay | Admitting: Gynecology

## 2018-03-18 ENCOUNTER — Ambulatory Visit (INDEPENDENT_AMBULATORY_CARE_PROVIDER_SITE_OTHER): Payer: Medicare Other

## 2018-03-18 ENCOUNTER — Encounter: Payer: Self-pay | Admitting: Gynecology

## 2018-03-18 DIAGNOSIS — M8589 Other specified disorders of bone density and structure, multiple sites: Secondary | ICD-10-CM

## 2018-03-18 DIAGNOSIS — Z780 Asymptomatic menopausal state: Secondary | ICD-10-CM

## 2018-05-14 DIAGNOSIS — Z1211 Encounter for screening for malignant neoplasm of colon: Secondary | ICD-10-CM | POA: Diagnosis not present

## 2018-05-14 DIAGNOSIS — I10 Essential (primary) hypertension: Secondary | ICD-10-CM | POA: Diagnosis not present

## 2018-05-14 DIAGNOSIS — E119 Type 2 diabetes mellitus without complications: Secondary | ICD-10-CM | POA: Diagnosis not present

## 2018-05-26 DIAGNOSIS — D649 Anemia, unspecified: Secondary | ICD-10-CM | POA: Diagnosis not present

## 2018-05-26 DIAGNOSIS — I10 Essential (primary) hypertension: Secondary | ICD-10-CM | POA: Diagnosis not present

## 2018-05-26 DIAGNOSIS — E119 Type 2 diabetes mellitus without complications: Secondary | ICD-10-CM | POA: Diagnosis not present

## 2018-05-26 DIAGNOSIS — Z79899 Other long term (current) drug therapy: Secondary | ICD-10-CM | POA: Diagnosis not present

## 2018-05-29 DIAGNOSIS — K635 Polyp of colon: Secondary | ICD-10-CM | POA: Diagnosis not present

## 2018-05-29 DIAGNOSIS — D123 Benign neoplasm of transverse colon: Secondary | ICD-10-CM | POA: Diagnosis not present

## 2018-05-29 DIAGNOSIS — Z1211 Encounter for screening for malignant neoplasm of colon: Secondary | ICD-10-CM | POA: Diagnosis not present

## 2018-05-30 DIAGNOSIS — E538 Deficiency of other specified B group vitamins: Secondary | ICD-10-CM | POA: Diagnosis not present

## 2018-05-30 DIAGNOSIS — I10 Essential (primary) hypertension: Secondary | ICD-10-CM | POA: Diagnosis not present

## 2018-05-30 DIAGNOSIS — R011 Cardiac murmur, unspecified: Secondary | ICD-10-CM | POA: Diagnosis not present

## 2018-05-30 DIAGNOSIS — M545 Low back pain: Secondary | ICD-10-CM | POA: Diagnosis not present

## 2018-05-30 DIAGNOSIS — E119 Type 2 diabetes mellitus without complications: Secondary | ICD-10-CM | POA: Diagnosis not present

## 2018-06-15 NOTE — Progress Notes (Signed)
Cardiology Office Note    Date:  06/16/2018   ID:  Karla Mullen, DOB 1951/07/28, MRN [id number]  PCP:  Jani Gravel, MD  Cardiologist: Sinclair Grooms, MD   Chief Complaint  Patient presents with  . Cardiac Valve Problem    VSD    History of Present Illness:  Karla Mullen is a 67 y.o. female  Referred from Dr. Jani Gravel for evaluation of heart murmur.  Seen the patient in the past.  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  Has a history of "heart surgery", which when described sounds like a left brachial heart catheterization.  Thoracotomy and true surgery was never performed.  History sounds like she had a VSD that was investigated and treated clinically.  Prior echocardiogram years ago was stable according to the patient (this was the previous encounter with me).  She is asymptomatic.  She is retired.  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  She has no physical limitations.  Past Medical History:  Diagnosis Date  . Diabetes mellitus   . Heart murmur   . Osteopenia 02/2018   T score -1.8 FRAX 3.9% / 0.5%    Past Surgical History:  Procedure Laterality Date  . ANKLE SURGERY Left   . CARDIAC SURGERY     had at 67 years old  . CHOLECYSTECTOMY      Current Medications: Outpatient Medications Prior to Visit  Medication Sig Dispense Refill  . aspirin EC 81 MG tablet Take 81 mg by mouth daily.    . Cyanocobalamin (VITAMIN B12 PO) Take 1 tablet by mouth daily.    Marland Kitchen losartan (COZAAR) 25 MG tablet Take 25 mg by mouth at bedtime.  6  . metFORMIN (GLUCOPHAGE) 500 MG tablet Take 1,000 mg by mouth 2 (two) times daily with a meal.    . Multiple Vitamin (MULITIVITAMIN WITH MINERALS) TABS Take 1 tablet by mouth daily.    . simvastatin (ZOCOR) 10 MG tablet Take 10 mg by mouth once a week.  6   No facility-administered medications prior to visit.       Allergies:   Patient has no known allergies.   Social History   Socioeconomic History  . Marital status: Single    Spouse name: Not on file  . Number of children: Not on file  . Years of education: Not on file  . Highest education level: Not on file  Occupational History  . Not on file  Social Needs  . Financial resource strain: Not on file  . Food insecurity:    Worry: Not on file    Inability: Not on file  . Transportation needs:    Medical: Not on file    Non-medical: Not on file  Tobacco Use  . Smoking status: Never Smoker  . Smokeless tobacco: Never Used  Substance and Sexual Activity  . Alcohol use: No  . Drug use: No  . Sexual activity: Never    Comment: 1st intercourse- 82, partners- 4,   Lifestyle  . Physical activity:    Days per week: Not on file    Minutes per session: Not on file  . Stress: Not on file  Relationships  . Social connections:    Talks on phone: Not on file    Gets together: Not on file    Attends religious service: Not on file  Active member of club or organization: Not on file    Attends meetings of clubs or organizations: Not on file    Relationship status: Not on file  Other Topics Concern  . Not on file  Social History Narrative  . Not on file     Family History:  The patient's family history includes Breast cancer in her maternal aunt and mother; Cancer in her cousin and maternal aunt; Diabetes in her maternal aunt; Heart attack in her father.   ROS:   Please see the history of present illness.    Diabetic, and hypercholesterolemia.  She denies arthritis, orthopnea, PND, syncope, patient's.  No peripheral edema. All other systems reviewed and are negative.   PHYSICAL EXAM:   VS:  BP 136/88   Pulse 64   Ht 5' 6.5" (1.689 m)   Wt 215 lb 9.6 oz (97.8 kg)   BMI 34.28 kg/m    GEN: Well nourished, well developed, in no acute distress  HEENT: normal  Neck: no JVD, carotid bruits, or masses Cardiac: RRR, rubs, or  gallops,no edema.  Holosystolic 3/6 murmur of VSD. Respiratory:  clear to auscultation bilaterally, normal work of breathing GI: soft, nontender, nondistended, + BS MS: no deformity or atrophy  Skin: warm and dry, no rash Neuro:  Alert and Oriented x 3, Strength and sensation are intact Psych: euthymic mood, full affect  Wt Readings from Last 3 Encounters:  06/16/18 215 lb 9.6 oz (97.8 kg)  02/24/18 217 lb (98.4 kg)      Studies/Labs Reviewed:   EKG:  EKG   Poor R wave progression V1 through V4.  Slight leftward axis.  Normal PR interval.   Recent Labs: No results found for requested labs within last 8760 hours.   Lipid Panel No results found for: CHOL, TRIG, HDL, CHOLHDL, VLDL, LDLCALC, LDLDIRECT  Additional studies/ records that were reviewed today include:  None    ASSESSMENT:    1. Heart murmur   2. Controlled type 2 diabetes mellitus without complication, without long-term current use of insulin ([place])   3. Hyperlipidemia LDL goal <70   4. Essential hypertension      PLAN:  In order of problems listed above:  1. Suspected ventricular septal defect.  Murmur is consistent as is history.  Clinical exam suggests a restrictive VSD.  No evidence of LVH or RVH on EKG.  2D Doppler echocardiogram to confirm normal heart size and function. 2. A1c target less than 7 and if preferable as low as possible. 3. LDL target less than 70.  Needs more aggressive management with at least moderate dose statin intensity. 4. Target 130/80 mmHg.  2D Doppler echocardiogram will be done.  Will speak with the patient after the study is performed to determine if any further work-up or management issues exist.    Medication Adjustments/Labs and Tests Ordered: Current medicines are reviewed at length with the patient today.  Concerns regarding medicines are outlined above.  Medication changes, Labs and Tests ordered today are listed in the Patient Instructions below. Patient Instructions   Medication Instructions:  Your physician recommends that you continue on your current medications as directed. Please refer to the Current Medication list given to you today.  Labwork: None  Testing/Procedures: Your physician has requested that you have an echocardiogram. Echocardiography is a painless test that uses sound waves to create images of your heart. It provides your doctor with information about the size and shape of your heart and how well your heart's  chambers and valves are working. This procedure takes approximately one hour. There are no restrictions for this procedure.   Follow-Up: Your physician recommends that you schedule a follow-up appointment as needed with Dr. Tamala Julian.    Any Other Special Instructions Will Be Listed Below (If Applicable).     If you need a refill on your cardiac medications before your next appointment, please call your pharmacy.      Signed, Sinclair Grooms, MD  06/16/2018 10:08 AM    [facility] [place], [place], [place]  [address] Phone: [phone]; Fax: [phone]

## 2018-06-16 ENCOUNTER — Encounter: Payer: Self-pay | Admitting: Interventional Cardiology

## 2018-06-16 ENCOUNTER — Ambulatory Visit (INDEPENDENT_AMBULATORY_CARE_PROVIDER_SITE_OTHER): Payer: Medicare Other | Admitting: Interventional Cardiology

## 2018-06-16 ENCOUNTER — Encounter (INDEPENDENT_AMBULATORY_CARE_PROVIDER_SITE_OTHER): Payer: Self-pay

## 2018-06-16 VITALS — BP 136/88 | HR 64 | Ht 66.5 in | Wt 215.6 lb

## 2018-06-16 DIAGNOSIS — E119 Type 2 diabetes mellitus without complications: Secondary | ICD-10-CM

## 2018-06-16 DIAGNOSIS — I10 Essential (primary) hypertension: Secondary | ICD-10-CM | POA: Diagnosis not present

## 2018-06-16 DIAGNOSIS — R011 Cardiac murmur, unspecified: Secondary | ICD-10-CM | POA: Diagnosis not present

## 2018-06-16 DIAGNOSIS — E785 Hyperlipidemia, unspecified: Secondary | ICD-10-CM

## 2018-06-16 NOTE — Patient Instructions (Signed)
Medication Instructions:  Your physician recommends that you continue on your current medications as directed. Please refer to the Current Medication list given to you today.  Labwork: None  Testing/Procedures: Your physician has requested that you have an echocardiogram. Echocardiography is a painless test that uses sound waves to create images of your heart. It provides your doctor with information about the size and shape of your heart and how well your heart's chambers and valves are working. This procedure takes approximately one hour. There are no restrictions for this procedure.   Follow-Up: Your physician recommends that you schedule a follow-up appointment as needed with Dr. Tamala Julian.    Any Other Special Instructions Will Be Listed Below (If Applicable).     If you need a refill on your cardiac medications before your next appointment, please call your pharmacy.

## 2018-06-23 ENCOUNTER — Ambulatory Visit (HOSPITAL_COMMUNITY): Payer: Medicare Other | Attending: Cardiovascular Disease

## 2018-06-23 ENCOUNTER — Other Ambulatory Visit: Payer: Self-pay

## 2018-06-23 DIAGNOSIS — E785 Hyperlipidemia, unspecified: Secondary | ICD-10-CM | POA: Insufficient documentation

## 2018-06-23 DIAGNOSIS — R011 Cardiac murmur, unspecified: Secondary | ICD-10-CM | POA: Diagnosis not present

## 2018-06-23 DIAGNOSIS — I088 Other rheumatic multiple valve diseases: Secondary | ICD-10-CM | POA: Insufficient documentation

## 2018-06-23 DIAGNOSIS — I10 Essential (primary) hypertension: Secondary | ICD-10-CM | POA: Diagnosis not present

## 2018-06-23 DIAGNOSIS — E119 Type 2 diabetes mellitus without complications: Secondary | ICD-10-CM | POA: Diagnosis not present

## 2018-07-29 ENCOUNTER — Other Ambulatory Visit: Payer: Self-pay | Admitting: Internal Medicine

## 2018-07-29 DIAGNOSIS — N6012 Diffuse cystic mastopathy of left breast: Secondary | ICD-10-CM

## 2018-08-12 ENCOUNTER — Ambulatory Visit (HOSPITAL_COMMUNITY)
Admission: EM | Admit: 2018-08-12 | Discharge: 2018-08-12 | Disposition: A | Discharge status: Home / Self Care | Payer: Medicare Other | Attending: Nurse Practitioner | Admitting: Nurse Practitioner

## 2018-08-12 ENCOUNTER — Encounter (HOSPITAL_COMMUNITY): Payer: Self-pay

## 2018-08-12 ENCOUNTER — Other Ambulatory Visit: Payer: Self-pay

## 2018-08-12 DIAGNOSIS — N3000 Acute cystitis without hematuria: Secondary | ICD-10-CM

## 2018-08-12 DIAGNOSIS — R1031 Right lower quadrant pain: Secondary | ICD-10-CM | POA: Diagnosis not present

## 2018-08-12 LAB — POCT URINALYSIS DIP (DEVICE)
Bilirubin Urine: NEGATIVE
GLUCOSE, UA: NEGATIVE mg/dL
Hgb urine dipstick: NEGATIVE
Ketones, ur: NEGATIVE mg/dL
NITRITE: NEGATIVE
PROTEIN: NEGATIVE mg/dL
Specific Gravity, Urine: >=1.03 (ref 1.005–1.030)
UROBILINOGEN UA: 0.2 mg/dL (ref 0.0–1.0)
pH: 5.5 (ref 5.0–8.0)

## 2018-08-12 MED ORDER — MELOXICAM 7.5 MG PO TABS: 7.5000 mg | ORAL_TABLET | Freq: Every day | ORAL | 0 refills | Status: AC

## 2018-08-12 MED ORDER — METRONIDAZOLE 500 MG PO TABS: 500.0000 mg | ORAL_TABLET | Freq: Three times a day (TID) | ORAL | 0 refills | Status: AC

## 2018-08-12 MED ORDER — TRAMADOL HCL 50 MG PO TABS: 50.0000 mg | ORAL_TABLET | Freq: Four times a day (QID) | ORAL | 0 refills | Status: AC | PRN

## 2018-08-12 MED ORDER — CIPROFLOXACIN HCL 500 MG PO TABS: 500.0000 mg | ORAL_TABLET | Freq: Two times a day (BID) | ORAL | 0 refills | Status: AC

## 2018-08-12 NOTE — ED Provider Notes (Signed)
[place]    CSN: [id number] Arrival date & time: 08/12/18  0805     History   Chief Complaint Chief Complaint  Patient presents with  . Back Pain    HPI Karla Mullen is a 67 y.o. female.   Subjective:   Karla Mullen is a 67 y.o. female who presents for evaluation of abdominal pain.  The patient was evaluated about 2 months ago by her PCP for back pain which was diagnosed as sciatica.  She was given muscle relaxers with improvement in her symptoms.  Over the past couple of weeks, the patient noted lower back pain which has now radiated around into the right lower quadrant.  Patient states that this pain is much different than her known sciatica.  The pain is described as sharp and is intermittent. Symptoms have been unchanged since. Aggravating factors include activity and movement.  Alleviating factors include motrin. Associated symptoms include diarrhea, urinary frequency and nausea. The patient denies any constipation, dysuria, headache, hematuria or vomiting.  The following portions of the patient's history were reviewed and updated as appropriate: allergies, current medications, past family history, past medical history, past social history, past surgical history and problem list.          Past Medical History:  Diagnosis Date  . Diabetes mellitus   . Heart murmur   . Osteopenia 02/2018   T score -1.8 FRAX 3.9% / 0.5%    Patient Active Problem List   Diagnosis Date Noted  . Heart murmur 01/26/2018    Past Surgical History:  Procedure Laterality Date  . ANKLE SURGERY Left   . CARDIAC SURGERY     had at 67 years old  . CHOLECYSTECTOMY      OB History    Gravida  0   Para  0   Term  0   Preterm  0   AB  0   Living  0     SAB  0   TAB  0   Ectopic  0   Multiple  0   Live Births  0            Home Medications    Prior to Admission medications   Medication Sig Start Date End Date Taking? Authorizing Provider  aspirin EC  81 MG tablet Take 81 mg by mouth daily.    [provider]  ciprofloxacin (CIPRO) 500 MG tablet Take 1 tablet (500 mg total) by mouth 2 (two) times daily for 7 days. 08/12/18 08/19/18  Enrique Sack, FNP  Cyanocobalamin (VITAMIN B12 PO) Take 1 tablet by mouth daily.    [provider]  losartan (COZAAR) 25 MG tablet Take 25 mg by mouth at bedtime. 05/30/18   [provider]  meloxicam (MOBIC) 7.5 MG tablet Take 1 tablet (7.5 mg total) by mouth daily for 14 days. 08/12/18 08/26/18  Enrique Sack, FNP  metFORMIN (GLUCOPHAGE) 500 MG tablet Take 1,000 mg by mouth 2 (two) times daily with a meal.    [provider]  metroNIDAZOLE (FLAGYL) 500 MG tablet Take 1 tablet (500 mg total) by mouth 3 (three) times daily for 7 days. 08/12/18 08/19/18  Enrique Sack, FNP  Multiple Vitamin (MULITIVITAMIN WITH MINERALS) TABS Take 1 tablet by mouth daily.    [provider]  simvastatin (ZOCOR) 10 MG tablet Take 10 mg by mouth once a week. 05/30/18   [provider]  traMADol (ULTRAM) 50 MG tablet Take 1 tablet (50 mg total) by  mouth every 6 (six) hours as needed for up to 7 days. 08/12/18 08/19/18  Enrique Sack, FNP    Family History Family History  Problem Relation Age of Onset  . Breast cancer Mother   . Breast cancer Maternal Aunt   . Cancer Cousin        uterine  . Heart attack Father   . Cancer Maternal Aunt        uterine  . Diabetes Maternal Aunt     Social History Social History   Tobacco Use  . Smoking status: Never Smoker  . Smokeless tobacco: Never Used  Substance Use Topics  . Alcohol use: No  . Drug use: No     Allergies   Patient has no known allergies.   Review of Systems Review of Systems  Constitutional: Negative for fever.  Gastrointestinal: Positive for abdominal pain, diarrhea and nausea. Negative for vomiting.  Genitourinary: Positive for frequency. Negative for dysuria.  Musculoskeletal: Positive for back  pain.  All other systems reviewed and are negative.    Physical Exam Triage Vital Signs ED Triage Vitals [08/12/18 0849]  Enc Vitals Group     BP 124/62     Pulse Rate 85     Resp 18     Temp 98.7 F (37.1 C)     Temp src      SpO2 100 %     Weight 210 lb (95.3 kg)     Height      Head Circumference      Peak Flow      Pain Score 8     Pain Loc      Pain Edu?      Excl. in [place]?    No data found.  Updated Vital Signs BP 124/62   Pulse 85   Temp 98.7 F (37.1 C)   Resp 18   Wt 210 lb (95.3 kg)   SpO2 100%   BMI 33.39 kg/m   Visual Acuity Right Eye Distance:   Left Eye Distance:   Bilateral Distance:    Right Eye Near:   Left Eye Near:    Bilateral Near:     Physical Exam  Constitutional: She is oriented to person, place, and time. She appears well-developed and well-nourished. No distress.  Neck: Normal range of motion. Neck supple.  Cardiovascular: Normal rate and regular rhythm.  Pulmonary/Chest: Effort normal and breath sounds normal.  Abdominal: Soft. Normal appearance and bowel sounds are normal. She exhibits no mass. There is tenderness in the right lower quadrant. There is guarding. There is no CVA tenderness and no tenderness at McBurney's point.  Musculoskeletal: Normal range of motion.  Neurological: She is alert and oriented to person, place, and time.  Skin: Skin is warm and dry.  Psychiatric: She has a normal mood and affect. Her behavior is normal.     UC Treatments / Results  Labs (all labs ordered are listed, but only abnormal results are displayed) Labs Reviewed  POCT URINALYSIS DIP (DEVICE) - Abnormal; Notable for the following components:      Result Value   Leukocytes, UA TRACE (*)    All other components within normal limits    EKG None  Radiology No results found.  Procedures Procedures (including critical care time)  Medications Ordered in UC Medications - No data to display  Initial Impression / Assessment and Plan  / UC Course  I have reviewed the triage vital signs and the nursing notes.  Pertinent labs &  imaging results that were available during my care of the patient were reviewed by me and considered in my medical decision making (see chart for details).    67 yo female presenting with a two-week history of lower back pain with radiation into the right lower quadrant.  Patient is nontoxic-appearing.  Afebrile.  Vital signs stable.  Abdominal exam reveals right lower quadrant tenderness with guarding.  Negative McBurney's point tenderness and no palpable mass.  UA shows trace leukocytes but otherwise negative.  Will empirically treat with a course of Cipro and Flagyl as well as NSAIDs.  Strict return precautions extensively discussed.  Today's evaluation has revealed no signs of a dangerous process. Discussed diagnosis with patient. Patient aware of their diagnosis, possible red flag symptoms to watch out for and need for close follow up. Patient understands verbal and written discharge instructions. Patient comfortable with plan and disposition.  Patient has a clear mental status at this time, good insight into illness (after discussion and teaching) and has clear judgment to make decisions regarding their care.  Documentation was completed with the aid of voice recognition software. Transcription may contain typographical errors. Final Clinical Impressions(s) / UC Diagnoses   Final diagnoses:  Right lower quadrant abdominal pain  Acute cystitis without hematuria     Discharge Instructions     Take medicines as prescribed. Follow-up with your primary care provider if symptoms do not improve. Go to ED immediately if symptoms worsen or you develop symptoms that we discussed today.     ED Prescriptions    Medication Sig Dispense Auth. Provider   ciprofloxacin (CIPRO) 500 MG tablet Take 1 tablet (500 mg total) by mouth 2 (two) times daily for 7 days. 14 tablet Raeven Pint, [place], FNP   metroNIDAZOLE  (FLAGYL) 500 MG tablet Take 1 tablet (500 mg total) by mouth 3 (three) times daily for 7 days. 21 tablet Enrique Sack, FNP   meloxicam (MOBIC) 7.5 MG tablet Take 1 tablet (7.5 mg total) by mouth daily for 14 days. 14 tablet Enrique Sack, FNP   traMADol (ULTRAM) 50 MG tablet Take 1 tablet (50 mg total) by mouth every 6 (six) hours as needed for up to 7 days. 15 tablet Enrique Sack, FNP     Controlled Substance Prescriptions [place] Controlled Substance Registry consulted? Yes, I have consulted the [place] Controlled Substances Registry for this patient, and feel the risk/benefit ratio today is favorable for proceeding with this prescription for a controlled substance.    Orlin Hilding [place], [place] 08/12/18 [id number]

## 2018-08-12 NOTE — ED Triage Notes (Signed)
Pt having back pain and its radiating down her right leg and groin area. This has been going on for a month.

## 2018-08-12 NOTE — Discharge Instructions (Signed)
Take medicines as prescribed. Follow-up with your primary care provider if symptoms do not improve. Go to ED immediately if symptoms worsen or you develop symptoms that we discussed today.

## 2018-09-12 ENCOUNTER — Ambulatory Visit
Admission: RE | Admit: 2018-09-12 | Discharge: 2018-09-12 | Disposition: A | Discharge status: Home / Self Care | Payer: Medicare Other | Source: Ambulatory Visit | Attending: Internal Medicine | Admitting: Internal Medicine

## 2018-09-12 DIAGNOSIS — R928 Other abnormal and inconclusive findings on diagnostic imaging of breast: Secondary | ICD-10-CM | POA: Diagnosis not present

## 2018-09-12 DIAGNOSIS — N6012 Diffuse cystic mastopathy of left breast: Secondary | ICD-10-CM

## 2018-09-22 DIAGNOSIS — E119 Type 2 diabetes mellitus without complications: Secondary | ICD-10-CM | POA: Diagnosis not present

## 2018-09-22 DIAGNOSIS — I10 Essential (primary) hypertension: Secondary | ICD-10-CM | POA: Diagnosis not present

## 2018-09-22 DIAGNOSIS — E538 Deficiency of other specified B group vitamins: Secondary | ICD-10-CM | POA: Diagnosis not present

## 2018-09-29 DIAGNOSIS — I10 Essential (primary) hypertension: Secondary | ICD-10-CM | POA: Diagnosis not present

## 2018-09-29 DIAGNOSIS — E785 Hyperlipidemia, unspecified: Secondary | ICD-10-CM | POA: Diagnosis not present

## 2018-09-29 DIAGNOSIS — E119 Type 2 diabetes mellitus without complications: Secondary | ICD-10-CM | POA: Diagnosis not present

## 2018-11-22 DIAGNOSIS — Z23 Encounter for immunization: Secondary | ICD-10-CM | POA: Diagnosis not present

## 2019-02-20 DIAGNOSIS — E785 Hyperlipidemia, unspecified: Secondary | ICD-10-CM | POA: Diagnosis not present

## 2019-02-20 DIAGNOSIS — I10 Essential (primary) hypertension: Secondary | ICD-10-CM | POA: Diagnosis not present

## 2019-02-20 DIAGNOSIS — E118 Type 2 diabetes mellitus with unspecified complications: Secondary | ICD-10-CM | POA: Diagnosis not present

## 2019-02-27 DIAGNOSIS — Z23 Encounter for immunization: Secondary | ICD-10-CM | POA: Diagnosis not present

## 2019-02-27 DIAGNOSIS — E118 Type 2 diabetes mellitus with unspecified complications: Secondary | ICD-10-CM | POA: Diagnosis not present

## 2019-02-27 DIAGNOSIS — I10 Essential (primary) hypertension: Secondary | ICD-10-CM | POA: Diagnosis not present

## 2019-02-27 DIAGNOSIS — Z0000 Encounter for general adult medical examination without abnormal findings: Secondary | ICD-10-CM | POA: Diagnosis not present

## 2019-03-04 ENCOUNTER — Other Ambulatory Visit: Payer: Self-pay | Admitting: Internal Medicine

## 2019-03-04 DIAGNOSIS — N6320 Unspecified lump in the left breast, unspecified quadrant: Secondary | ICD-10-CM

## 2019-03-12 ENCOUNTER — Other Ambulatory Visit: Payer: Self-pay

## 2019-03-12 ENCOUNTER — Ambulatory Visit
Admission: RE | Admit: 2019-03-12 | Discharge: 2019-03-12 | Disposition: A | Discharge status: Home / Self Care | Payer: Medicare Other | Source: Ambulatory Visit | Attending: Internal Medicine | Admitting: Internal Medicine

## 2019-03-12 DIAGNOSIS — N6320 Unspecified lump in the left breast, unspecified quadrant: Secondary | ICD-10-CM | POA: Diagnosis not present

## 2019-04-21 ENCOUNTER — Other Ambulatory Visit: Payer: Self-pay

## 2019-04-21 NOTE — Patient Outreach (Signed)
[facility]) Care Management  04/21/2019  Karla Mullen [birth year] [id number]   Medication Adherence call to [name] patient did not answer patient is due on Losartan 25 mg and Simvastatin 10 mg under [place].   [facility] Management Direct Dial [phone]  Fax [phone] [email or web address]

## 2019-05-07 ENCOUNTER — Other Ambulatory Visit: Payer: Self-pay

## 2019-05-12 ENCOUNTER — Ambulatory Visit: Payer: Medicare Other | Admitting: Obstetrics & Gynecology

## 2019-05-12 ENCOUNTER — Other Ambulatory Visit: Payer: Self-pay

## 2019-05-12 ENCOUNTER — Encounter: Payer: Self-pay | Admitting: Obstetrics & Gynecology

## 2019-05-12 VITALS — BP 128/78 | Ht 67.0 in | Wt 213.0 lb

## 2019-05-12 DIAGNOSIS — Z780 Asymptomatic menopausal state: Secondary | ICD-10-CM

## 2019-05-12 DIAGNOSIS — E6609 Other obesity due to excess calories: Secondary | ICD-10-CM | POA: Diagnosis not present

## 2019-05-12 DIAGNOSIS — M8589 Other specified disorders of bone density and structure, multiple sites: Secondary | ICD-10-CM | POA: Diagnosis not present

## 2019-05-12 DIAGNOSIS — Z6833 Body mass index (BMI) 33.0-33.9, adult: Secondary | ICD-10-CM

## 2019-05-12 DIAGNOSIS — Z01419 Encounter for gynecological examination (general) (routine) without abnormal findings: Secondary | ICD-10-CM | POA: Diagnosis not present

## 2019-05-12 NOTE — Patient Instructions (Signed)
1. Well female exam with routine gynecological exam Normal Gynecologic exam in menopause.  Pap test negative last year, no indication to repeat this year.  Breasts normal.  Screening Mammo benign 02/2019.  Health Labs with Fam MD.  Colonoscopy 2019.  2. Postmenopause Well on no HRT.  No PMB.  Abstinent. - DG Bone Density; Future  3. Osteopenia of multiple sites Osteopenia on BD 02/2018.  Taking Vitamin D supplements, Ca++ intake of 1200-1500 mg daily recommended, regular weightbearing physical activity recommended.  Will repeat BD 03/2020. - DG Bone Density; Future  4. Class 1 obesity due to excess calories without serious comorbidity with body mass index (BMI) of 33.0 to 33.9 in adult Obesity with BMI at 33.36.  Recommend a low calorie/low carb diet such as [employer].  Recommend aerobic activities 5 times a week and weight lifting every 2 days.  Other orders - Omega-3 Fatty Acids (FISH OIL) 1000 MG CAPS; Take by mouth.  Nekita, it was a pleasure seeing you today!

## 2019-05-12 NOTE — Progress Notes (Signed)
Karla Mullen 1951/07/17 [id number]   History:    68 y.o. G0 Single.  Taking care of her autistic brother  RP:  Established patient presenting for annual gyn exam   HPI: Menopause, well on no hormones.  No postmenopausal bleeding.  No pelvic pain.  Abstinent x 3 years.  Urine and bowel movements normal.  Breasts normal.  Body mass index 33.36.  Lost 4 pounds since last year.  Walking regularly.  Past medical history,surgical history, family history and social history were all reviewed and documented in the EPIC chart.  Gynecologic History No LMP recorded. Patient is postmenopausal. Contraception: abstinence and post menopausal status Last Pap: 02/2018. Results were: Negative Last mammogram: 02/2019. Results were: Benign Bone Density: 02/2018 Osteopenia.  Will schedule BD 02/2020 Colonoscopy: 2019  Obstetric History OB History  Gravida Para Term Preterm AB Living  0 0 0 0 0 0  SAB TAB Ectopic Multiple Live Births  0 0 0 0 0     ROS: A ROS was performed and pertinent positives and negatives are included in the history.  GENERAL: No fevers or chills. HEENT: No change in vision, no earache, sore throat or sinus congestion. NECK: No pain or stiffness. CARDIOVASCULAR: No chest pain or pressure. No palpitations. PULMONARY: No shortness of breath, cough or wheeze. GASTROINTESTINAL: No abdominal pain, nausea, vomiting or diarrhea, melena or bright red blood per rectum. GENITOURINARY: No urinary frequency, urgency, hesitancy or dysuria. MUSCULOSKELETAL: No joint or muscle pain, no back pain, no recent trauma. DERMATOLOGIC: No rash, no itching, no lesions. ENDOCRINE: No polyuria, polydipsia, no heat or cold intolerance. No recent change in weight. HEMATOLOGICAL: No anemia or easy bruising or bleeding. NEUROLOGIC: No headache, seizures, numbness, tingling or weakness. PSYCHIATRIC: No depression, no loss of interest in normal activity or change in sleep pattern.     Exam:   BP 128/78   Ht 5\' 7"   (1.702 m)   Wt 213 lb (96.6 kg)   BMI 33.36 kg/m   Body mass index is 33.36 kg/m.  General appearance : Well developed well nourished female. No acute distress HEENT: Eyes: no retinal hemorrhage or exudates,  Neck supple, trachea midline, no carotid bruits, no thyroidmegaly Lungs: Clear to auscultation, no rhonchi or wheezes, or rib retractions  Heart: Regular rate and rhythm, no murmurs or gallops Breast:Examined in sitting and supine position were symmetrical in appearance, no palpable masses or tenderness,  no skin retraction, no nipple inversion, no nipple discharge, no skin discoloration, no axillary or supraclavicular lymphadenopathy Abdomen: no palpable masses or tenderness, no rebound or guarding Extremities: no edema or skin discoloration or tenderness  Pelvic: Vulva: Normal             Vagina: No gross lesions or discharge  Cervix: No gross lesions or discharge  Uterus RV, normal size, shape and consistency, non-tender and mobile  Adnexa  Without masses or tenderness  Anus: Normal   Assessment/Plan:  68 y.o. female for annual exam   1. Well female exam with routine gynecological exam Normal Gynecologic exam in menopause.  Pap test negative last year, no indication to repeat this year.  Breasts normal.  Screening Mammo benign 02/2019.  Health Labs with Fam MD.  Colonoscopy 2019.  2. Postmenopause Well on no HRT.  No PMB.  Abstinent. - DG Bone Density; Future  3. Osteopenia of multiple sites Osteopenia on BD 02/2018.  Taking Vitamin D supplements, Ca++ intake of 1200-1500 mg daily recommended, regular weightbearing physical activity recommended.  Will  repeat BD 03/2020. - DG Bone Density; Future  4. Class 1 obesity due to excess calories without serious comorbidity with body mass index (BMI) of 33.0 to 33.9 in adult Obesity with BMI at 33.36.  Recommend a low calorie/low carb diet such as [employer].  Recommend aerobic activities 5 times a week and weight lifting  every 2 days.  Other orders - Omega-3 Fatty Acids (FISH OIL) 1000 MG CAPS; Take by mouth.  [name] MD, 8:18 AM 05/12/2019

## 2019-05-30 DIAGNOSIS — E119 Type 2 diabetes mellitus without complications: Secondary | ICD-10-CM | POA: Diagnosis not present

## 2019-06-09 ENCOUNTER — Other Ambulatory Visit: Payer: Self-pay

## 2019-06-09 NOTE — Patient Outreach (Signed)
[facility]) Care Management  06/09/2019  Karla Mullen Nov 26, 1951 [id number]   Medication Adherence call to Karla Mullen [facility] [employer] spoke with patient she is past due on Losartan 25 mg patient explain she was only taking it once a week but now she is back on taking 1 tablet daily on Losartan 25 mg patient ask if we can call [facility] an order [facility]. Karla Mullen is showing past due under [place].   [facility] Management Direct Dial [phone]  Fax [phone] [email or web address]

## 2019-09-30 ENCOUNTER — Encounter: Payer: Self-pay | Admitting: Gynecology

## 2020-03-07 ENCOUNTER — Other Ambulatory Visit: Payer: Self-pay | Admitting: Internal Medicine

## 2020-03-07 DIAGNOSIS — Z1231 Encounter for screening mammogram for malignant neoplasm of breast: Secondary | ICD-10-CM

## 2020-03-14 DIAGNOSIS — E08311 Diabetes mellitus due to underlying condition with unspecified diabetic retinopathy with macular edema: Secondary | ICD-10-CM | POA: Diagnosis not present

## 2020-03-14 DIAGNOSIS — E083211 Diabetes mellitus due to underlying condition with mild nonproliferative diabetic retinopathy with macular edema, right eye: Secondary | ICD-10-CM | POA: Diagnosis not present

## 2020-03-14 DIAGNOSIS — E119 Type 2 diabetes mellitus without complications: Secondary | ICD-10-CM | POA: Diagnosis not present

## 2020-03-14 DIAGNOSIS — I10 Essential (primary) hypertension: Secondary | ICD-10-CM | POA: Diagnosis not present

## 2020-03-21 DIAGNOSIS — E119 Type 2 diabetes mellitus without complications: Secondary | ICD-10-CM | POA: Diagnosis not present

## 2020-03-21 DIAGNOSIS — Z0000 Encounter for general adult medical examination without abnormal findings: Secondary | ICD-10-CM | POA: Diagnosis not present

## 2020-03-21 DIAGNOSIS — N644 Mastodynia: Secondary | ICD-10-CM | POA: Diagnosis not present

## 2020-03-21 DIAGNOSIS — E785 Hyperlipidemia, unspecified: Secondary | ICD-10-CM | POA: Diagnosis not present

## 2020-03-21 DIAGNOSIS — I10 Essential (primary) hypertension: Secondary | ICD-10-CM | POA: Diagnosis not present

## 2020-03-22 ENCOUNTER — Other Ambulatory Visit: Payer: Self-pay | Admitting: Internal Medicine

## 2020-03-22 DIAGNOSIS — N644 Mastodynia: Secondary | ICD-10-CM

## 2020-05-02 ENCOUNTER — Ambulatory Visit: Payer: Medicare Other

## 2020-05-02 ENCOUNTER — Ambulatory Visit
Admission: RE | Admit: 2020-05-02 | Discharge: 2020-05-02 | Disposition: A | Discharge status: Home / Self Care | Payer: Medicare Other | Source: Ambulatory Visit | Attending: Internal Medicine | Admitting: Internal Medicine

## 2020-05-02 ENCOUNTER — Other Ambulatory Visit: Payer: Self-pay

## 2020-05-02 DIAGNOSIS — R928 Other abnormal and inconclusive findings on diagnostic imaging of breast: Secondary | ICD-10-CM | POA: Diagnosis not present

## 2020-05-02 DIAGNOSIS — N644 Mastodynia: Secondary | ICD-10-CM

## 2020-05-12 ENCOUNTER — Encounter: Payer: Medicare Other | Admitting: Obstetrics & Gynecology

## 2020-06-08 ENCOUNTER — Other Ambulatory Visit: Payer: Self-pay

## 2020-06-09 ENCOUNTER — Ambulatory Visit: Payer: Medicare Other | Admitting: Obstetrics & Gynecology

## 2020-06-09 ENCOUNTER — Encounter: Payer: Self-pay | Admitting: Obstetrics & Gynecology

## 2020-06-09 VITALS — BP 130/70 | Ht 66.75 in | Wt 207.0 lb

## 2020-06-09 DIAGNOSIS — Z01419 Encounter for gynecological examination (general) (routine) without abnormal findings: Secondary | ICD-10-CM

## 2020-06-09 DIAGNOSIS — M8589 Other specified disorders of bone density and structure, multiple sites: Secondary | ICD-10-CM

## 2020-06-09 DIAGNOSIS — Z780 Asymptomatic menopausal state: Secondary | ICD-10-CM | POA: Diagnosis not present

## 2020-06-09 DIAGNOSIS — E6609 Other obesity due to excess calories: Secondary | ICD-10-CM | POA: Diagnosis not present

## 2020-06-09 DIAGNOSIS — Z6832 Body mass index (BMI) 32.0-32.9, adult: Secondary | ICD-10-CM

## 2020-06-09 DIAGNOSIS — R1031 Right lower quadrant pain: Secondary | ICD-10-CM

## 2020-06-09 NOTE — Progress Notes (Signed)
Karla Mullen [birth year] [id number]   History:    69 y.o. G0 Single.  Taking care of her autistic brother  RP:  Established patient presenting for annual gyn exam   HPI: Menopause, well on no hormones.  No postmenopausal bleeding.  No pelvic pain.  Abstinent x 3 years.  Urine and bowel movements normal.  Breasts normal.  Body mass index 32.66.  Walking regularly.  Health Labs with Fam MD.  Karla Mullen 2019.   Past medical history,surgical history, family history and social history were all reviewed and documented in the EPIC chart.  Gynecologic History No LMP recorded. Patient is postmenopausal.  Obstetric History OB History  Gravida Para Term Preterm AB Living  0 0 0 0 0 0  SAB TAB Ectopic Multiple Live Births  0 0 0 0 0     ROS: A ROS was performed and pertinent positives and negatives are included in the history.  GENERAL: No fevers or chills. HEENT: No change in vision, no earache, sore throat or sinus congestion. NECK: No pain or stiffness. CARDIOVASCULAR: No chest pain or pressure. No palpitations. PULMONARY: No shortness of breath, cough or wheeze. GASTROINTESTINAL: No abdominal pain, nausea, vomiting or diarrhea, melena or bright red blood per rectum. GENITOURINARY: No urinary frequency, urgency, hesitancy or dysuria. MUSCULOSKELETAL: No joint or muscle pain, no back pain, no recent trauma. DERMATOLOGIC: No rash, no itching, no lesions. ENDOCRINE: No polyuria, polydipsia, no heat or cold intolerance. No recent change in weight. HEMATOLOGICAL: No anemia or easy bruising or bleeding. NEUROLOGIC: No headache, seizures, numbness, tingling or weakness. PSYCHIATRIC: No depression, no loss of interest in normal activity or change in sleep pattern.     Exam:   BP 130/70   Ht 5' 6.75" (1.695 m)   Wt 207 lb (93.9 kg)   BMI 32.66 kg/m   Body mass index is 32.66 kg/m.  General appearance : Well developed well nourished female. No acute distress HEENT: Eyes: no retinal hemorrhage  or exudates,  Neck supple, trachea midline, no carotid bruits, no thyroidmegaly Lungs: Clear to auscultation, no rhonchi or wheezes, or rib retractions  Heart: Regular rate and rhythm, no murmurs or gallops Breast:Examined in sitting and supine position were symmetrical in appearance, no palpable masses or tenderness,  no skin retraction, no nipple inversion, no nipple discharge, no skin discoloration, no axillary or supraclavicular lymphadenopathy Abdomen: no palpable masses or tenderness, no rebound or guarding Extremities: no edema or skin discoloration or tenderness  Pelvic: Vulva: Normal             Vagina: No gross lesions or discharge  Cervix: No gross lesions or discharge  Uterus  AV, normal size, shape and consistency, non-tender and mobile  Adnexa  Without masses or tenderness  Anus: Normal   Assessment/Plan:  69 y.o. female for annual exam   1. Well female exam with routine gynecological exam normal gynecologic exam in menopause.  Pap test negative in March 2019, no indication to repeat this year.  Breast exam normal.  Screening mammogram May 2021 was negative.  Colonoscopy 2019.  Health labs with family physician.  2. Postmenopause well on no hormone replacement therapy.  No postmenopausal bleeding.  3. Osteopenia of multiple sites osteopenia on bone density March 2019 at bilateral femoral neck.  We will repeat a bone density now.  Vitamin D supplements, calcium intake of 1200 mg daily and regular weightbearing physical activity is recommended. - DG Bone Density; Future  4. Right lower quadrant abdominal pain intermittent  right pelvic pains.  Rule out ovarian pathology.  We will follow-up with a pelvic ultrasound. - US Transvaginal Non-OB; Future  5. Class 1 obesity due to excess calories without serious comorbidity with body mass index (BMI) of 32.0 to 32.9 in adult recommend a lower calorie/carb diet such as [employer].  Aerobic activities 5 times a week and light  weightlifting every 2 days.  [name] MD, 9:48 AM 06/09/2020

## 2020-06-09 NOTE — Patient Instructions (Signed)
1. Well female exam with routine gynecological exam normal gynecologic exam in menopause.  Pap test negative in March 2019, no indication to repeat this year.  Breast exam normal.  Screening mammogram May 2021 was negative.  Colonoscopy 2019.  Health labs with family physician.  2. Postmenopause well on no hormone replacement therapy.  No postmenopausal bleeding.  3. Osteopenia of multiple sites osteopenia on bone density March 2019 at bilateral femoral neck.  We will repeat a bone density now.  Vitamin D supplements, calcium intake of 1200 mg daily and regular weightbearing physical activity is recommended. - DG Bone Density; Future  4. Right lower quadrant abdominal pain intermittent right pelvic pains.  Rule out ovarian pathology.  We will follow-up with a pelvic ultrasound. - US Transvaginal Non-OB; Future  5. Class 1 obesity due to excess calories without serious comorbidity with body mass index (BMI) of 32.0 to 32.9 in adult recommend a lower calorie/carb diet such as [employer].  Aerobic activities 5 times a week and light weightlifting every 2 days.  Selisa, it was a pleasure seeing you today!

## 2020-07-07 ENCOUNTER — Other Ambulatory Visit: Payer: Medicare Other

## 2020-07-07 ENCOUNTER — Ambulatory Visit: Payer: Medicare Other | Admitting: Obstetrics & Gynecology

## 2020-07-12 ENCOUNTER — Other Ambulatory Visit: Payer: Self-pay | Admitting: Obstetrics & Gynecology

## 2020-07-12 ENCOUNTER — Ambulatory Visit (INDEPENDENT_AMBULATORY_CARE_PROVIDER_SITE_OTHER): Payer: Medicare Other

## 2020-07-12 ENCOUNTER — Other Ambulatory Visit: Payer: Self-pay

## 2020-07-12 DIAGNOSIS — M8589 Other specified disorders of bone density and structure, multiple sites: Secondary | ICD-10-CM

## 2020-07-12 DIAGNOSIS — Z780 Asymptomatic menopausal state: Secondary | ICD-10-CM

## 2020-07-28 ENCOUNTER — Other Ambulatory Visit: Payer: Medicare Other

## 2020-07-28 ENCOUNTER — Ambulatory Visit: Payer: Medicare Other | Admitting: Obstetrics & Gynecology

## 2020-09-23 DIAGNOSIS — E119 Type 2 diabetes mellitus without complications: Secondary | ICD-10-CM | POA: Diagnosis not present

## 2020-09-23 DIAGNOSIS — E785 Hyperlipidemia, unspecified: Secondary | ICD-10-CM | POA: Diagnosis not present

## 2020-09-28 DIAGNOSIS — E785 Hyperlipidemia, unspecified: Secondary | ICD-10-CM | POA: Diagnosis not present

## 2020-09-28 DIAGNOSIS — I10 Essential (primary) hypertension: Secondary | ICD-10-CM | POA: Diagnosis not present

## 2020-09-28 DIAGNOSIS — Z23 Encounter for immunization: Secondary | ICD-10-CM | POA: Diagnosis not present

## 2020-09-28 DIAGNOSIS — E119 Type 2 diabetes mellitus without complications: Secondary | ICD-10-CM | POA: Diagnosis not present

## 2021-03-23 DIAGNOSIS — R946 Abnormal results of thyroid function studies: Secondary | ICD-10-CM | POA: Diagnosis not present

## 2021-03-23 DIAGNOSIS — E119 Type 2 diabetes mellitus without complications: Secondary | ICD-10-CM | POA: Diagnosis not present

## 2021-03-23 DIAGNOSIS — I10 Essential (primary) hypertension: Secondary | ICD-10-CM | POA: Diagnosis not present

## 2021-03-23 DIAGNOSIS — E785 Hyperlipidemia, unspecified: Secondary | ICD-10-CM | POA: Diagnosis not present

## 2021-04-06 DIAGNOSIS — Z0000 Encounter for general adult medical examination without abnormal findings: Secondary | ICD-10-CM | POA: Diagnosis not present

## 2021-04-06 DIAGNOSIS — I10 Essential (primary) hypertension: Secondary | ICD-10-CM | POA: Diagnosis not present

## 2021-04-11 ENCOUNTER — Other Ambulatory Visit: Payer: Self-pay | Admitting: Registered Nurse

## 2021-04-11 DIAGNOSIS — Z1231 Encounter for screening mammogram for malignant neoplasm of breast: Secondary | ICD-10-CM

## 2021-06-01 ENCOUNTER — Ambulatory Visit
Admission: RE | Admit: 2021-06-01 | Discharge: 2021-06-01 | Disposition: A | Discharge status: Home / Self Care | Payer: Medicare Other | Source: Ambulatory Visit | Attending: Registered Nurse | Admitting: Registered Nurse

## 2021-06-01 ENCOUNTER — Other Ambulatory Visit: Payer: Self-pay

## 2021-06-01 DIAGNOSIS — Z1231 Encounter for screening mammogram for malignant neoplasm of breast: Secondary | ICD-10-CM

## 2021-10-06 DIAGNOSIS — E785 Hyperlipidemia, unspecified: Secondary | ICD-10-CM | POA: Diagnosis not present

## 2021-10-06 DIAGNOSIS — E118 Type 2 diabetes mellitus with unspecified complications: Secondary | ICD-10-CM | POA: Diagnosis not present

## 2021-10-13 DIAGNOSIS — E1165 Type 2 diabetes mellitus with hyperglycemia: Secondary | ICD-10-CM | POA: Diagnosis not present

## 2021-10-13 DIAGNOSIS — I10 Essential (primary) hypertension: Secondary | ICD-10-CM | POA: Diagnosis not present

## 2021-10-13 DIAGNOSIS — E785 Hyperlipidemia, unspecified: Secondary | ICD-10-CM | POA: Diagnosis not present

## 2021-10-13 DIAGNOSIS — N3000 Acute cystitis without hematuria: Secondary | ICD-10-CM | POA: Diagnosis not present

## 2022-04-03 DIAGNOSIS — E538 Deficiency of other specified B group vitamins: Secondary | ICD-10-CM | POA: Diagnosis not present

## 2022-04-03 DIAGNOSIS — Z0000 Encounter for general adult medical examination without abnormal findings: Secondary | ICD-10-CM | POA: Diagnosis not present

## 2022-04-03 DIAGNOSIS — R946 Abnormal results of thyroid function studies: Secondary | ICD-10-CM | POA: Diagnosis not present

## 2022-04-03 DIAGNOSIS — E785 Hyperlipidemia, unspecified: Secondary | ICD-10-CM | POA: Diagnosis not present

## 2022-04-03 DIAGNOSIS — E119 Type 2 diabetes mellitus without complications: Secondary | ICD-10-CM | POA: Diagnosis not present

## 2022-04-03 DIAGNOSIS — I10 Essential (primary) hypertension: Secondary | ICD-10-CM | POA: Diagnosis not present

## 2022-05-04 DIAGNOSIS — E1165 Type 2 diabetes mellitus with hyperglycemia: Secondary | ICD-10-CM | POA: Diagnosis not present

## 2022-05-04 DIAGNOSIS — K5900 Constipation, unspecified: Secondary | ICD-10-CM | POA: Diagnosis not present

## 2022-05-04 DIAGNOSIS — Z1212 Encounter for screening for malignant neoplasm of rectum: Secondary | ICD-10-CM | POA: Diagnosis not present

## 2022-05-04 DIAGNOSIS — D649 Anemia, unspecified: Secondary | ICD-10-CM | POA: Diagnosis not present

## 2022-05-04 DIAGNOSIS — E785 Hyperlipidemia, unspecified: Secondary | ICD-10-CM | POA: Diagnosis not present

## 2022-05-04 DIAGNOSIS — I10 Essential (primary) hypertension: Secondary | ICD-10-CM | POA: Diagnosis not present

## 2022-05-04 DIAGNOSIS — E538 Deficiency of other specified B group vitamins: Secondary | ICD-10-CM | POA: Diagnosis not present

## 2022-05-04 DIAGNOSIS — Z0000 Encounter for general adult medical examination without abnormal findings: Secondary | ICD-10-CM | POA: Diagnosis not present

## 2022-05-10 ENCOUNTER — Other Ambulatory Visit: Payer: Self-pay | Admitting: Registered Nurse

## 2022-05-10 ENCOUNTER — Other Ambulatory Visit: Payer: Self-pay | Admitting: Obstetrics & Gynecology

## 2022-05-10 DIAGNOSIS — Z1231 Encounter for screening mammogram for malignant neoplasm of breast: Secondary | ICD-10-CM

## 2022-06-01 DIAGNOSIS — E538 Deficiency of other specified B group vitamins: Secondary | ICD-10-CM | POA: Diagnosis not present

## 2022-06-01 DIAGNOSIS — K5900 Constipation, unspecified: Secondary | ICD-10-CM | POA: Diagnosis not present

## 2022-06-01 DIAGNOSIS — D649 Anemia, unspecified: Secondary | ICD-10-CM | POA: Diagnosis not present

## 2022-06-08 DIAGNOSIS — D508 Other iron deficiency anemias: Secondary | ICD-10-CM | POA: Diagnosis not present

## 2022-06-08 DIAGNOSIS — E538 Deficiency of other specified B group vitamins: Secondary | ICD-10-CM | POA: Diagnosis not present

## 2022-06-08 DIAGNOSIS — K5900 Constipation, unspecified: Secondary | ICD-10-CM | POA: Diagnosis not present

## 2022-06-12 ENCOUNTER — Ambulatory Visit
Admission: RE | Admit: 2022-06-12 | Discharge: 2022-06-12 | Disposition: A | Discharge status: Home / Self Care | Payer: Medicare HMO | Source: Ambulatory Visit | Attending: Obstetrics & Gynecology | Admitting: Obstetrics & Gynecology

## 2022-06-12 DIAGNOSIS — Z1231 Encounter for screening mammogram for malignant neoplasm of breast: Secondary | ICD-10-CM | POA: Diagnosis not present

## 2022-07-17 ENCOUNTER — Ambulatory Visit (INDEPENDENT_AMBULATORY_CARE_PROVIDER_SITE_OTHER): Payer: Medicare HMO | Admitting: Obstetrics & Gynecology

## 2022-07-17 ENCOUNTER — Other Ambulatory Visit (HOSPITAL_COMMUNITY)
Admission: RE | Admit: 2022-07-17 | Discharge: 2022-07-17 | Disposition: A | Discharge status: Home / Self Care | Payer: Medicare HMO | Source: Ambulatory Visit | Attending: Obstetrics & Gynecology | Admitting: Obstetrics & Gynecology

## 2022-07-17 ENCOUNTER — Encounter: Payer: Self-pay | Admitting: Obstetrics & Gynecology

## 2022-07-17 VITALS — BP 110/70 | HR 60 | Ht 65.75 in | Wt 170.0 lb

## 2022-07-17 DIAGNOSIS — Z01419 Encounter for gynecological examination (general) (routine) without abnormal findings: Secondary | ICD-10-CM

## 2022-07-17 DIAGNOSIS — M8589 Other specified disorders of bone density and structure, multiple sites: Secondary | ICD-10-CM | POA: Diagnosis not present

## 2022-07-17 DIAGNOSIS — N904 Leukoplakia of vulva: Secondary | ICD-10-CM

## 2022-07-17 DIAGNOSIS — Z780 Asymptomatic menopausal state: Secondary | ICD-10-CM

## 2022-07-17 MED ORDER — CLOBETASOL PROPIONATE 0.05 % EX OINT: 1.0000 | TOPICAL_OINTMENT | Freq: Two times a day (BID) | CUTANEOUS | 4 refills | Status: DC

## 2022-07-17 NOTE — Progress Notes (Signed)
Karla Mullen 10-Oct-1951 [id number]   History:    71 y.o.  G0 Single.  Taking care of her autistic brother   RP:  Established patient presenting for annual gyn exam    HPI: Postmenopause, well on no hormones.  No postmenopausal bleeding.  No pelvic pain. C/O vulvar itching with whitening of the vulva.  No improvement with Vagisil.  Abstinent x 4 years.  Pap 02/2018 Neg.  No h/o abnormal Pap.  Pap reflex today.  Urine and bowel movements normal.  Breasts normal.  Mammo Neg 05/2022.  Body mass index improved to 27.65.  Lower calorie diet.  Taking supplements/adjusting nutrition to make sure it is healthy.  Had low iron and low proteins per Health labs with Fam MD.  Walking regularly.  [place] 2019.  BMD: 07-12-20 Rt Femoral Neck T-Score -2.0.  Repeat BD here now.  Past medical history,surgical history, family history and social history were all reviewed and documented in the EPIC chart.  Gynecologic History No LMP recorded. Patient is postmenopausal.  Obstetric History OB History  Gravida Para Term Preterm AB Living  0 0 0 0 0 0  SAB IAB Ectopic Multiple Live Births  0 0 0 0 0     ROS: A ROS was performed and pertinent positives and negatives are included in the history. GENERAL: No fevers or chills. HEENT: No change in vision, no earache, sore throat or sinus congestion. NECK: No pain or stiffness. CARDIOVASCULAR: No chest pain or pressure. No palpitations. PULMONARY: No shortness of breath, cough or wheeze. GASTROINTESTINAL: No abdominal pain, nausea, vomiting or diarrhea, melena or bright red blood per rectum. GENITOURINARY: No urinary frequency, urgency, hesitancy or dysuria. MUSCULOSKELETAL: No joint or muscle pain, no back pain, no recent trauma. DERMATOLOGIC: No rash, no itching, no lesions. ENDOCRINE: No polyuria, polydipsia, no heat or cold intolerance. No recent change in weight. HEMATOLOGICAL: No anemia or easy bruising or bleeding. NEUROLOGIC: No headache, seizures, numbness, tingling  or weakness. PSYCHIATRIC: No depression, no loss of interest in normal activity or change in sleep pattern.     Exam:   BP 110/70   Pulse 60   Ht 5' 5.75" (1.67 m)   Wt 170 lb (77.1 kg)   SpO2 98%   BMI 27.65 kg/m   Body mass index is 27.65 kg/m.  General appearance : Well developed well nourished female. No acute distress HEENT: Eyes: no retinal hemorrhage or exudates,  Neck supple, trachea midline, no carotid bruits, no thyroidmegaly Lungs: Clear to auscultation, no rhonchi or wheezes, or rib retractions  Heart: Regular rate and rhythm, no murmurs or gallops Breast:Examined in sitting and supine position were symmetrical in appearance, no palpable masses or tenderness,  no skin retraction, no nipple inversion, no nipple discharge, no skin discoloration, no axillary or supraclavicular lymphadenopathy Abdomen: no palpable masses or tenderness, no rebound or guarding Extremities: no edema or skin discoloration or tenderness  Pelvic: Vulva: Completely depigmented, white with inflammation and atrophy c/w Lichen Sclerosus             Vagina: No gross lesions or discharge  Cervix: No gross lesions or discharge.  Pap reflex done.  Uterus  AV, normal size, shape and consistency, non-tender and mobile  Adnexa  Without masses or tenderness  Anus: Normal   Assessment/Plan:  71 y.o. female for annual exam   1. Encounter for routine gynecological examination with Papanicolaou smear of cervix Postmenopause, well on no hormones.  No postmenopausal bleeding.  No pelvic pain. C/O  vulvar itching with whitening of the vulva.  No improvement with Vagisil.  Abstinent x 4 years.  Pap 02/2018 Neg.  No h/o abnormal Pap.  Pap reflex today.  Urine and bowel movements normal.  Breasts normal.  Mammo Neg 05/2022.  Body mass index improved to 27.65.  Lower calorie diet.  Taking supplements/adjusting nutrition to make sure it is healthy.  Had low iron and low proteins per Health labs with Fam MD.  Walking  regularly.  [place] 2019.  BMD: 07-12-20 Rt Femoral Neck T-Score -2.0.  Repeat BD here now. - Cytology - PAP( [facility])  2. Postmenopause Postmenopause, well on no hormones.  No postmenopausal bleeding.  No pelvic pain.  3. Osteopenia of multiple sites  BMD: 07-12-20 Rt Femoral Neck T-Score -2.0.  Repeat BD here now.  Vit D supplement, Ca++ 1.5 g/d total and regular weight bearing activities. - DG Bone Density; Future  4. Lichen sclerosus et atrophicus of the vulva C/O vulvar itching with whitening of the vulva.  No improvement with Vagisil.  Vulvar exam reveals a completely white, depigmented vulva with inflammation and atrophy c/w Lichen Sclerosus.  Diagnosis discussed with patient.  Management reviewed.  Will treat with Clobetasol ointment 0.05%.  Usage reviewed and prescription sent to pharmacy.  F/U in 1 month to assess improvement and possible vulvar biopsy.  Other orders - Ferrous Sulfate (IRON PO); Take by mouth daily. - FIBER PO; Take by mouth. - clobetasol ointment (TEMOVATE) 0.05 %; Apply 1 Application topically 2 (two) times daily.   [name] MD, 3:26 PM 07/17/2022

## 2022-07-23 LAB — CYTOLOGY - PAP: Diagnosis: NEGATIVE

## 2022-07-25 ENCOUNTER — Ambulatory Visit (INDEPENDENT_AMBULATORY_CARE_PROVIDER_SITE_OTHER): Payer: Medicare HMO

## 2022-07-25 ENCOUNTER — Other Ambulatory Visit: Payer: Self-pay | Admitting: Obstetrics & Gynecology

## 2022-07-25 DIAGNOSIS — Z13820 Encounter for screening for osteoporosis: Secondary | ICD-10-CM

## 2022-07-25 DIAGNOSIS — Z780 Asymptomatic menopausal state: Secondary | ICD-10-CM | POA: Diagnosis not present

## 2022-07-25 DIAGNOSIS — M8589 Other specified disorders of bone density and structure, multiple sites: Secondary | ICD-10-CM

## 2022-08-03 DIAGNOSIS — E785 Hyperlipidemia, unspecified: Secondary | ICD-10-CM | POA: Diagnosis not present

## 2022-08-03 DIAGNOSIS — E1165 Type 2 diabetes mellitus with hyperglycemia: Secondary | ICD-10-CM | POA: Diagnosis not present

## 2022-08-03 DIAGNOSIS — D508 Other iron deficiency anemias: Secondary | ICD-10-CM | POA: Diagnosis not present

## 2022-08-03 DIAGNOSIS — E538 Deficiency of other specified B group vitamins: Secondary | ICD-10-CM | POA: Diagnosis not present

## 2022-08-10 DIAGNOSIS — E785 Hyperlipidemia, unspecified: Secondary | ICD-10-CM | POA: Diagnosis not present

## 2022-08-10 DIAGNOSIS — I10 Essential (primary) hypertension: Secondary | ICD-10-CM | POA: Diagnosis not present

## 2022-08-10 DIAGNOSIS — E538 Deficiency of other specified B group vitamins: Secondary | ICD-10-CM | POA: Diagnosis not present

## 2022-08-10 DIAGNOSIS — E1165 Type 2 diabetes mellitus with hyperglycemia: Secondary | ICD-10-CM | POA: Diagnosis not present

## 2022-08-13 ENCOUNTER — Ambulatory Visit (INDEPENDENT_AMBULATORY_CARE_PROVIDER_SITE_OTHER): Payer: Medicare HMO | Admitting: Obstetrics & Gynecology

## 2022-08-13 ENCOUNTER — Encounter: Payer: Self-pay | Admitting: Obstetrics & Gynecology

## 2022-08-13 VITALS — BP 130/80

## 2022-08-13 DIAGNOSIS — N904 Leukoplakia of vulva: Secondary | ICD-10-CM

## 2022-08-13 MED ORDER — CLOBETASOL PROPIONATE 0.05 % EX OINT: 1.0000 | TOPICAL_OINTMENT | CUTANEOUS | 4 refills | Status: DC

## 2022-08-13 NOTE — Progress Notes (Signed)
    Karla Mullen 1951/12/19 [id number]        71 y.o.  G0  RP: F/U Lichen Sclerosus of the vulva on Clobetasol  HPI: Good response to Clobetasol ointment with complete resolution of itching and discomfort at the vulva.  The same area of the skin at the vulva is still white.  Applying the ointment twice a week currently.   OB History  Gravida Para Term Preterm AB Living  0 0 0 0 0 0  SAB IAB Ectopic Multiple Live Births  0 0 0 0 0    Past medical history,surgical history, problem list, medications, allergies, family history and social history were all reviewed and documented in the EPIC chart.   Directed ROS with pertinent positives and negatives documented in the history of present illness/assessment and plan.  Exam:  Vitals:   08/13/22 1506  BP: 130/80   General appearance:  Normal   Gynecologic exam: Vulva: Stable depigmented area of the vulva.  No longer showing inflammation and no progression of atrophy.      Assessment/Plan:  71 y.o. G0P0000   1. Lichen sclerosus et atrophicus of the vulva Good response to Clobetasol ointment with complete resolution of itching and discomfort at the vulva.  The same area of the skin at the vulva is still white.  Applying the ointment twice a week currently.   Exam of the Vulva: Stable depigmented area of the vulva.  No longer showing inflammation and no progression of atrophy.  Patient reassured that she is responding well to Clobetasol ointment.  Also informed that the pigmentation she lost will not come back.  No concerning lesion, therefore no Bx done.  Will continue with Clobetasol ointment twice a week.  If becomes symptomatic again with itching and irritation will increase the frequency of applications to daily until resolution of symptoms.  Clobetasol ointment 0.05% prescription sent to pharmacy with refills.  Other orders - clobetasol ointment (TEMOVATE) 0.05 %; Apply 1 Application topically 2 (two) times a week. Will increase the  vulvar applications up to daily if symptoms of itching recur.   Karla Bruins MD, 3:18 PM 08/13/2022

## 2022-10-16 IMAGING — MG MM DIGITAL SCREENING BILAT W/ TOMO AND CAD
6 of 10 series · 6 of 30 positions shown · non-contrast
Comparison: Previous exam(s).

ACR Breast Density Category a: The breast tissue is almost entirely
fatty.

CLINICAL DATA: Screening.

EXAM:
DIGITAL SCREENING BILATERAL MAMMOGRAM WITH TOMOSYNTHESIS AND CAD
TECHNIQUE: Bilateral screening digital craniocaudal and mediolateral oblique
mammograms were obtained. Bilateral screening digital breast
tomosynthesis was performed. The images were evaluated with
computer-aided detection.

[R CC synth-2D]
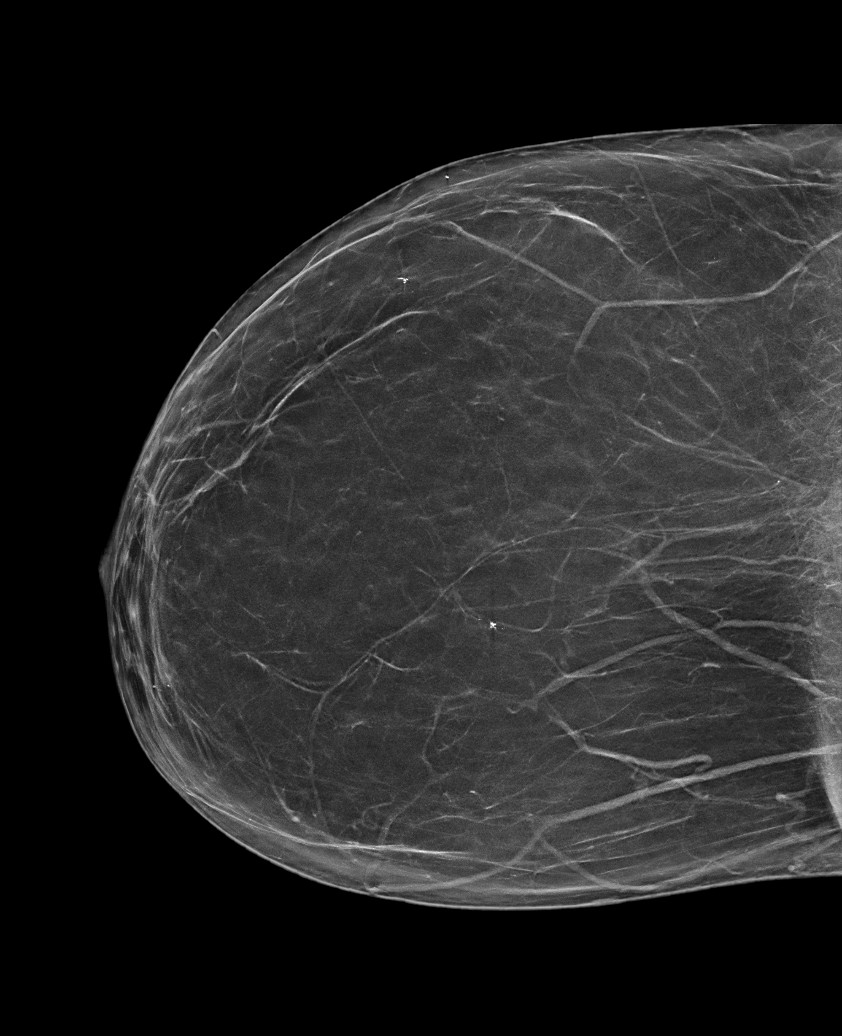

[L CC synth-2D]
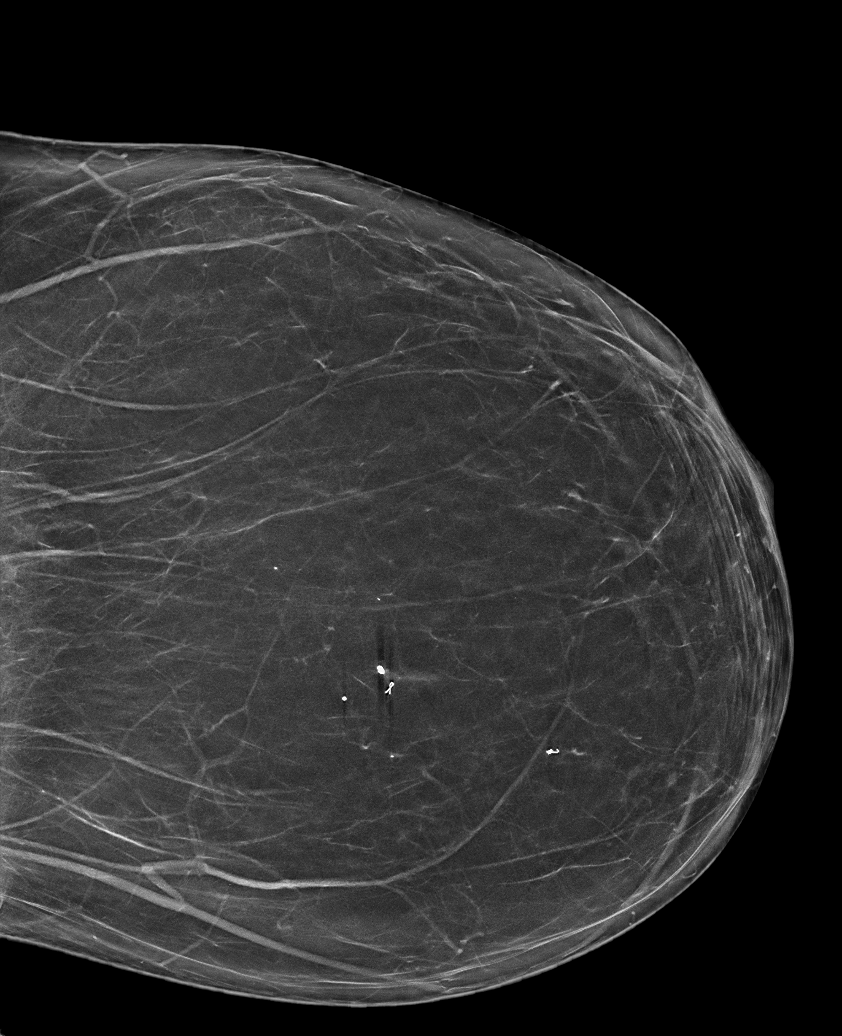

[R MLO synth-2D]
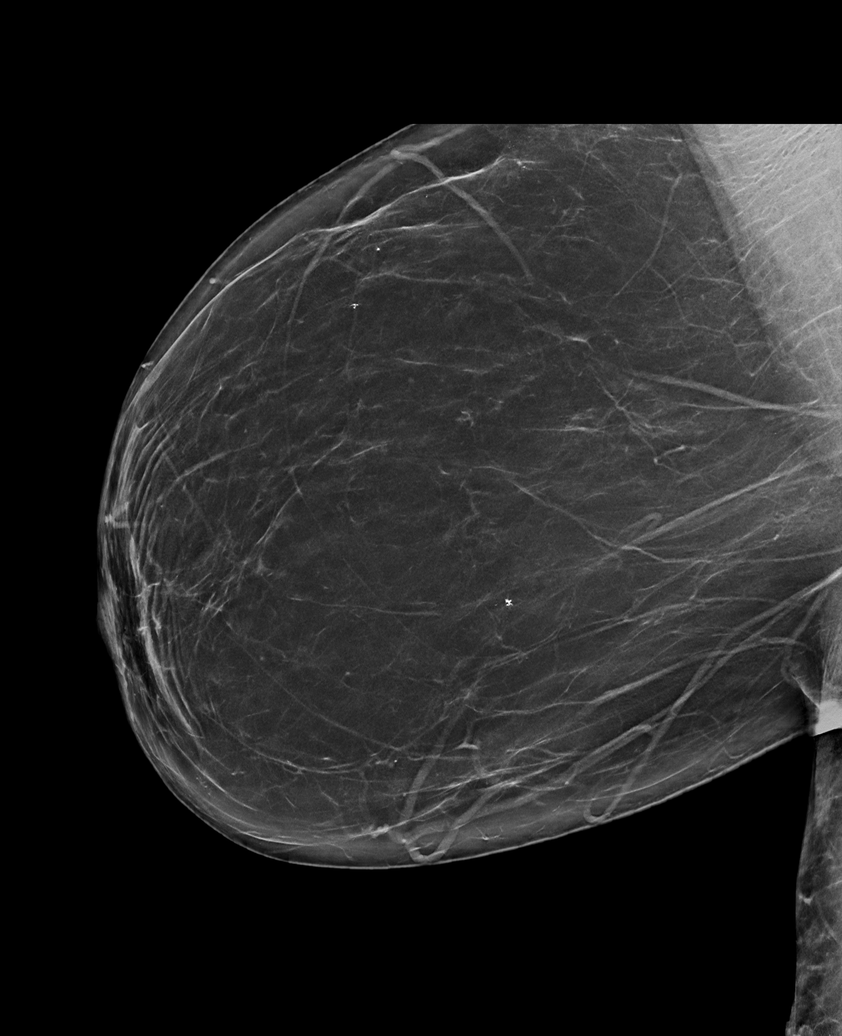

[L MLO synth-2D (1 of 2)]
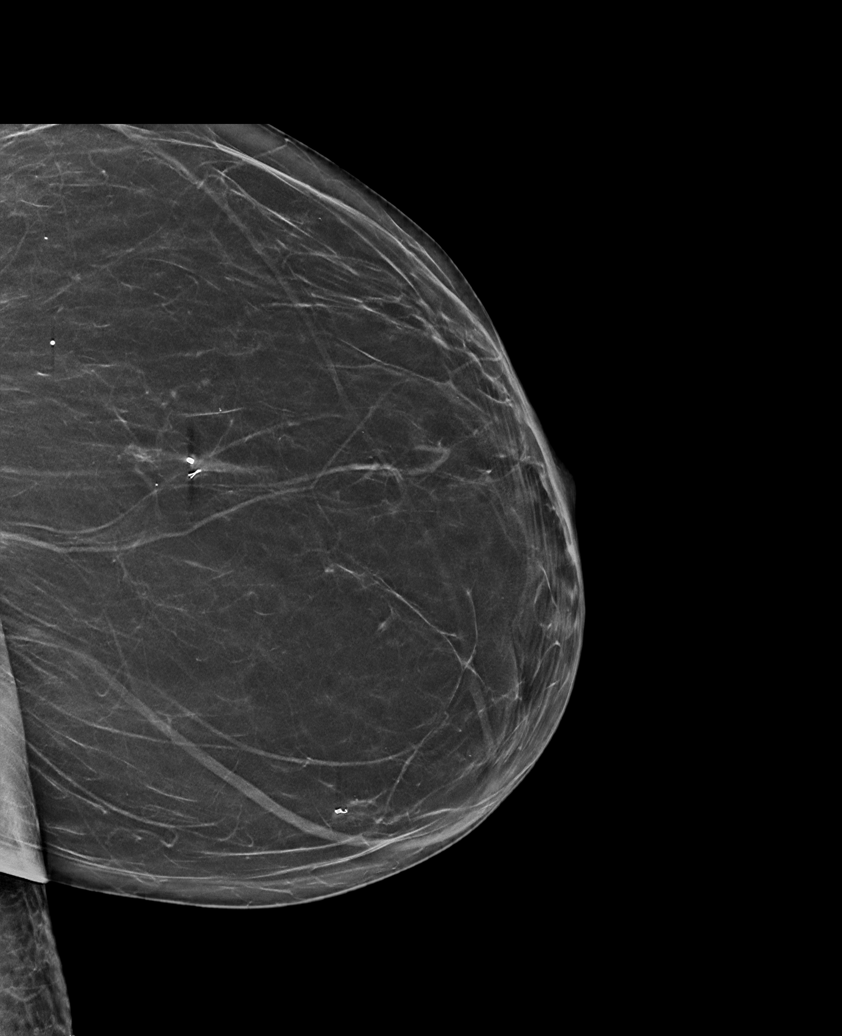

[L MLO synth-2D (2 of 2)]
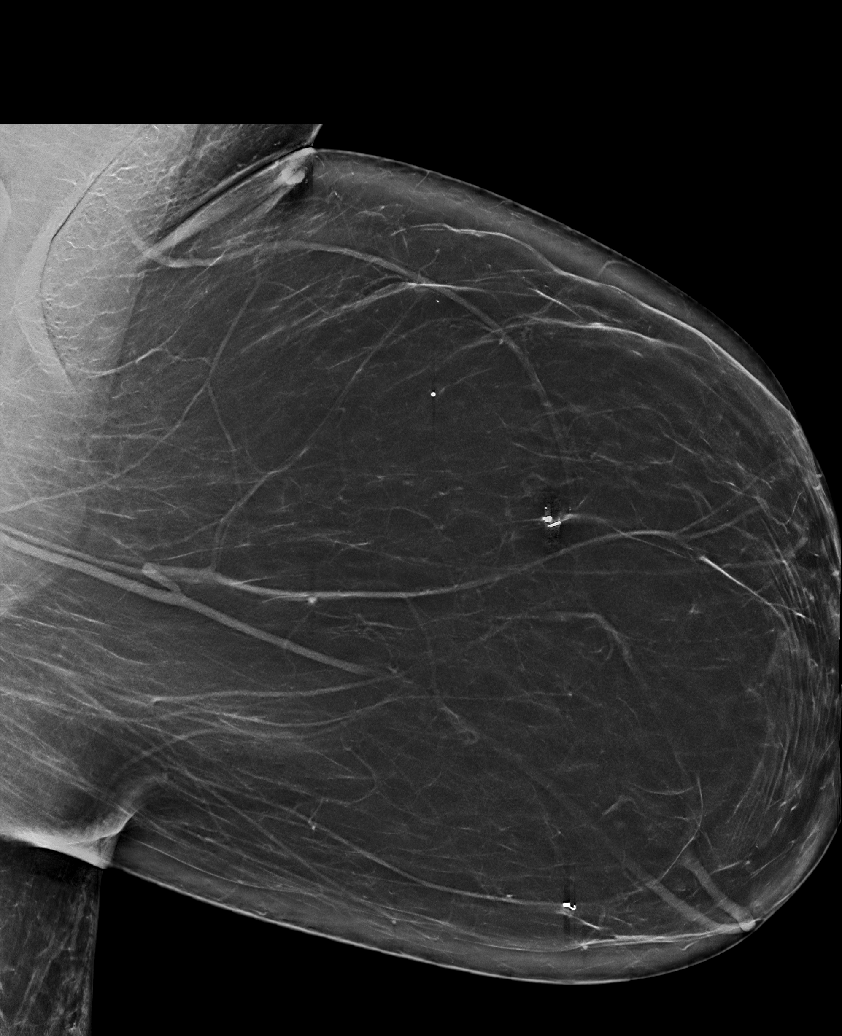

[L MLO tomo · tomo slice 34/67.0]
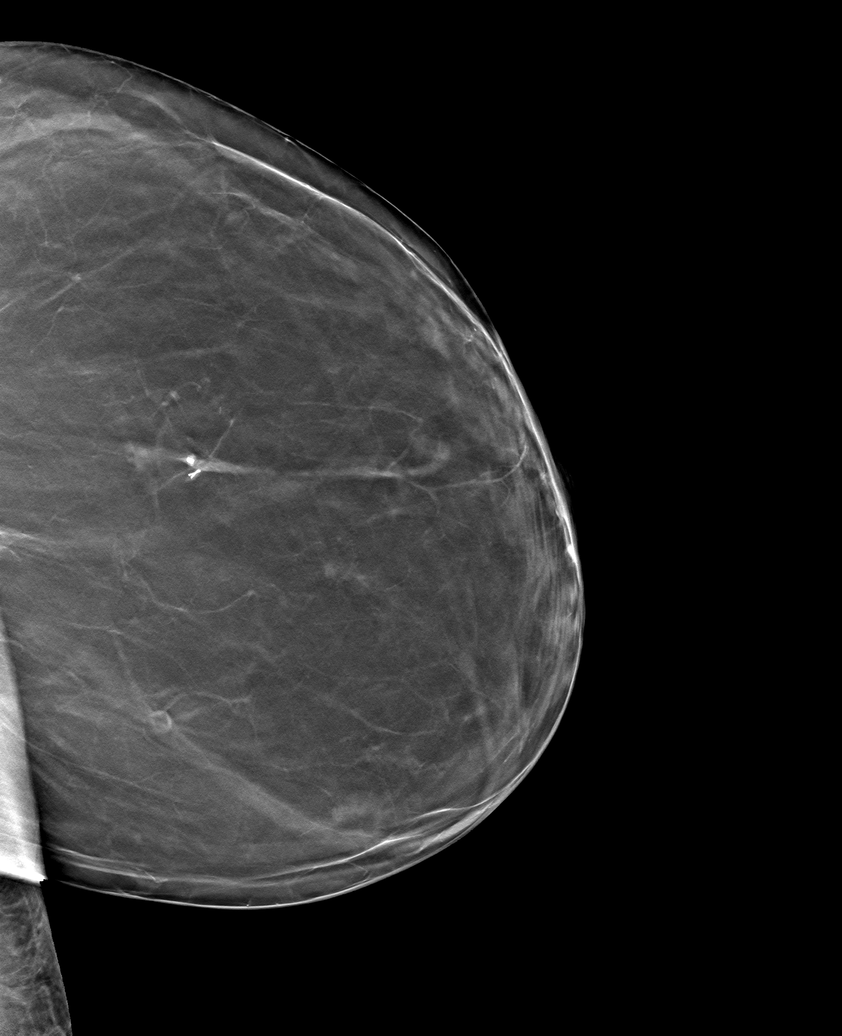

[6 of 30 positions shown; findings below may reference images not displayed]

FINDINGS: There are no findings suspicious for malignancy. The images were
evaluated with computer-aided detection.
IMPRESSION: No mammographic evidence of malignancy. A result letter of this
screening mammogram will be mailed directly to the patient.

RECOMMENDATION:
Screening mammogram in one year. (Code:JP-J-DD5)

BI-RADS CATEGORY  1: Negative.

## 2022-12-06 DIAGNOSIS — R6889 Other general symptoms and signs: Secondary | ICD-10-CM | POA: Diagnosis not present

## 2022-12-06 DIAGNOSIS — E538 Deficiency of other specified B group vitamins: Secondary | ICD-10-CM | POA: Diagnosis not present

## 2022-12-06 DIAGNOSIS — E1165 Type 2 diabetes mellitus with hyperglycemia: Secondary | ICD-10-CM | POA: Diagnosis not present

## 2022-12-06 DIAGNOSIS — E785 Hyperlipidemia, unspecified: Secondary | ICD-10-CM | POA: Diagnosis not present

## 2022-12-11 DIAGNOSIS — I10 Essential (primary) hypertension: Secondary | ICD-10-CM | POA: Diagnosis not present

## 2022-12-11 DIAGNOSIS — E538 Deficiency of other specified B group vitamins: Secondary | ICD-10-CM | POA: Diagnosis not present

## 2022-12-11 DIAGNOSIS — Z23 Encounter for immunization: Secondary | ICD-10-CM | POA: Diagnosis not present

## 2022-12-11 DIAGNOSIS — E1165 Type 2 diabetes mellitus with hyperglycemia: Secondary | ICD-10-CM | POA: Diagnosis not present

## 2022-12-11 DIAGNOSIS — E785 Hyperlipidemia, unspecified: Secondary | ICD-10-CM | POA: Diagnosis not present

## 2023-04-24 ENCOUNTER — Telehealth: Payer: Self-pay | Admitting: *Deleted

## 2023-04-24 DIAGNOSIS — Z1211 Encounter for screening for malignant neoplasm of colon: Secondary | ICD-10-CM | POA: Diagnosis not present

## 2023-04-24 DIAGNOSIS — E119 Type 2 diabetes mellitus without complications: Secondary | ICD-10-CM | POA: Diagnosis not present

## 2023-04-24 DIAGNOSIS — I10 Essential (primary) hypertension: Secondary | ICD-10-CM | POA: Diagnosis not present

## 2023-04-24 NOTE — Telephone Encounter (Signed)
   Pre-operative Risk Assessment    Patient Name: Karla Mullen  DOB: 08-26-1951 MRN: [id number]      Request for Surgical Clearance    Procedure:   COLONOSCOPY  Date of Surgery:  Clearance 08/21/23                                 Surgeon:  DR. HUNG Surgeon's Group or Practice Name:  [facility] Phone number:  [phone] Fax number:  [phone]   Type of Clearance Requested:   - Medical ; NO MEDICATIONS LISTED AS NEEDING TO BE HELD   Type of Anesthesia:   PROPOFOL   Additional requests/questions:    Elpidio Anis   04/24/2023, 6:07 PM

## 2023-04-24 NOTE — Telephone Encounter (Signed)
   Name: Karla Mullen  DOB: [birth year]  MRN: [id number]  Primary Cardiologist: None  Chart reviewed as part of pre-operative protocol coverage. Because of [name] past medical history and time since last visit, she will require a follow-up in-office visit in order to better assess preoperative cardiovascular risk.  Pre-op covering staff: - Please schedule appointment and call patient to inform them. If patient already had an upcoming appointment within acceptable timeframe, please add "pre-op clearance" to the appointment notes so provider is aware. - Please contact requesting surgeon's office via preferred method (i.e, phone, fax) to inform them of need for appointment prior to surgery.   Napoleon Form, Leodis Rains, NP  04/24/2023, 9:27 PM

## 2023-04-25 NOTE — Telephone Encounter (Signed)
Pt needs a new pt appt, last seen since 2019. Will send message to chart prep and scheduling to help with appt.

## 2023-04-30 ENCOUNTER — Emergency Department (HOSPITAL_COMMUNITY): Payer: Medicare HMO

## 2023-04-30 ENCOUNTER — Emergency Department (HOSPITAL_COMMUNITY)
Admission: EM | Admit: 2023-04-30 | Discharge: 2023-04-30 | Disposition: A | Discharge status: Home / Self Care | Payer: Medicare HMO | Attending: Emergency Medicine | Admitting: Emergency Medicine

## 2023-04-30 ENCOUNTER — Encounter (HOSPITAL_COMMUNITY): Payer: Self-pay

## 2023-04-30 ENCOUNTER — Other Ambulatory Visit: Payer: Self-pay

## 2023-04-30 DIAGNOSIS — Z043 Encounter for examination and observation following other accident: Secondary | ICD-10-CM | POA: Diagnosis not present

## 2023-04-30 DIAGNOSIS — W010XXA Fall on same level from slipping, tripping and stumbling without subsequent striking against object, initial encounter: Secondary | ICD-10-CM | POA: Diagnosis not present

## 2023-04-30 DIAGNOSIS — Z7982 Long term (current) use of aspirin: Secondary | ICD-10-CM | POA: Diagnosis not present

## 2023-04-30 DIAGNOSIS — M25531 Pain in right wrist: Secondary | ICD-10-CM | POA: Diagnosis not present

## 2023-04-30 DIAGNOSIS — S6981XA Other specified injuries of right wrist, hand and finger(s), initial encounter: Secondary | ICD-10-CM | POA: Diagnosis not present

## 2023-04-30 NOTE — Discharge Instructions (Signed)
You came to the ED today after a fall and wrist injury. Thankfully nothing is broken. Ice, elevate and use your brace as needed. Tylenol and ibuprofen are good options for pain. Do not hesitate to return with any further concerns

## 2023-04-30 NOTE — ED Provider Notes (Signed)
[place] EMERGENCY DEPARTMENT AT [facility] Provider Note   CSN: [id number] Arrival date & time: 04/30/23  1140     History  Chief Complaint  Patient presents with   Hand Pain    Karla Mullen is a 72 y.o. female presenting today after mechanical fall that happened yesterday.  She was wearing slick shoes in the rain and slipped backwards landing on her right wrist.  Reports previous injury to this wrist.  No thinners and did not hit her head.  Concerned her right hand could be broken.  No numbness or tingling but feels like her mobility is limited in her right thumb   Hand Pain       Home Medications Prior to Admission medications   Medication Sig Start Date End Date Taking? Authorizing Provider  aspirin EC 81 MG tablet Take 81 mg by mouth daily.    [provider]  clobetasol ointment (TEMOVATE) 0.05 % Apply 1 Application topically 2 (two) times a week. Will increase the vulvar applications up to daily if symptoms of itching recur. 08/13/22   Genia Del, MD  Cyanocobalamin (VITAMIN B12 PO) Take 1 tablet by mouth daily.    [provider]  Ferrous Sulfate (IRON PO) Take by mouth daily.    [provider]  FIBER PO Take by mouth.    [provider]  losartan (COZAAR) 25 MG tablet Take 25 mg by mouth at bedtime. 05/30/18   [provider]  metFORMIN (GLUCOPHAGE) 500 MG tablet Take 500 mg by mouth 2 (two) times daily with a meal.    [provider]  Multiple Vitamin (MULITIVITAMIN WITH MINERALS) TABS Take 1 tablet by mouth daily.    [provider]  Omega-3 Fatty Acids (FISH OIL) 1000 MG CAPS Take by mouth.    [provider]  simvastatin (ZOCOR) 10 MG tablet Take 10 mg by mouth daily. 05/30/18   [provider]      Allergies    Cetirizine and Pravastatin sodium    Review of Systems   Review of Systems  Physical Exam Updated Vital Signs BP (!) 147/84 (BP Location: Left Arm)    Pulse 66   Temp 98.2 F (36.8 C) (Oral)   Resp 19   Ht 5' 6.5" (1.689 m)   Wt 77.1 kg   SpO2 100%   BMI 27.03 kg/m  Physical Exam Vitals and nursing note reviewed.  Constitutional:      Appearance: Normal appearance.  HENT:     Head: Normocephalic and atraumatic.  Eyes:     General: No scleral icterus.    Conjunctiva/sclera: Conjunctivae normal.  Pulmonary:     Effort: Pulmonary effort is normal. No respiratory distress.  Musculoskeletal:     Comments: Full range of motion of the wrist with flexion, extension, radial and ulnar deviation.  Full range of motion of MCPs.  Some swelling over the dorsal surface of the right wrist and third metacarpal.  No lacerations or abrasions.  Strong radial pulse with normal cap refill in all digits  Skin:    Findings: No rash.  Neurological:     Mental Status: She is alert.  Psychiatric:        Mood and Affect: Mood normal.     ED Results / Procedures / Treatments   Labs (all labs ordered are listed, but only abnormal results are displayed) Labs Reviewed - No data to display  EKG None  Radiology No results found.  Procedures Procedures  Medications Ordered in ED Medications - No data to display  ED Course/ Medical Decision Making/ A&P                             Medical Decision Making Amount and/or Complexity of Data Reviewed Radiology: ordered.   72 year old female presenting today with a fall.  Purely mechanical.  No head trauma or blood thinners.  She is concerned that her right hand could be broken.  X-ray ordered, viewed and interpreted by me.  I agree with radiology that  Dispo: 72 year old presenting after a fall. Is consistent with a mechanical fall and not seizure/syncope, hypotension or medication side effect. Xray negative and NV intact. Will be dcd with brace.   Final Clinical Impression(s) / ED Diagnoses Final diagnoses:  Right wrist pain    Rx / DC Orders ED Discharge Orders     None       Results and diagnoses were explained to the patient. Return precautions discussed in full. Patient had no additional questions and expressed complete understanding.   This chart was dictated using voice recognition software.  Despite best efforts to proofread,  errors can occur which can change the documentation meaning.     Woodroe Chen 04/30/23 1404    Benjiman Core, MD 04/30/23 [phone]

## 2023-04-30 NOTE — ED Triage Notes (Signed)
Patient slipped and fell yesterday onto her right hand. Iced it last night but pain has worsened. Slight numbness to the right thumb. Able to move fingers.

## 2023-05-01 DIAGNOSIS — R946 Abnormal results of thyroid function studies: Secondary | ICD-10-CM | POA: Diagnosis not present

## 2023-05-01 DIAGNOSIS — E538 Deficiency of other specified B group vitamins: Secondary | ICD-10-CM | POA: Diagnosis not present

## 2023-05-01 DIAGNOSIS — E1165 Type 2 diabetes mellitus with hyperglycemia: Secondary | ICD-10-CM | POA: Diagnosis not present

## 2023-05-01 DIAGNOSIS — E785 Hyperlipidemia, unspecified: Secondary | ICD-10-CM | POA: Diagnosis not present

## 2023-05-01 NOTE — Telephone Encounter (Signed)
Lvm to schedule pt appt for upcoming surgical clearance.  Pt has not been seen since 2019.

## 2023-05-01 NOTE — Telephone Encounter (Signed)
Patient scheduled in office visit on 5/10.

## 2023-05-02 LAB — LAB REPORT - SCANNED
A1c: 6.2
EGFR: 43

## 2023-05-03 ENCOUNTER — Ambulatory Visit: Payer: Medicare HMO | Admitting: Nurse Practitioner

## 2023-05-05 NOTE — Progress Notes (Unsigned)
Cardiology Clinic Note   Patient Name: Karla Mullen Date of Encounter: 05/07/2023  Primary Care Provider:  Irena Reichmann, DO Primary Cardiologist:  None  Patient Profile    Karla Mullen 72 year old female presents to the clinic today for follow-up evaluation of her cardiac murmur.  Past Medical History    Past Medical History:  Diagnosis Date   Diabetes mellitus    Heart murmur    Osteopenia 02/2018   T score -1.8 FRAX 3.9% / 0.5%   Past Surgical History:  Procedure Laterality Date   ANKLE SURGERY Left    BREAST BIOPSY Left 2019   CARDIAC SURGERY     had at 72 years old   CHOLECYSTECTOMY      Allergies  Allergies  Allergen Reactions   Cetirizine     Other reaction(s): nausea, diarrhea   Pravastatin Sodium     Other reaction(s): nausea, vomitting    History of Present Illness    Karla Mullen has a PMH of cardiac murmur, controlled type 2 diabetes, hyperlipidemia, HTN.  Her PMH also includes heart surgery which was felt to be left brachial heart catheterization.  It was felt that thoracotomy and true heart surgery was never performed.  After reviewing history Dr. Katrinka Blazing felt like she had ventricular septal defect.  It was felt that the defect had been investigated and treated clinically.  Review of echocardiogram from years prior remained stable according to the patient.  She was seen  by Dr. Katrinka Blazing on 06/16/2018.  No old records were available to evaluate.  She has been seen before.  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  She was asymptomatic.  She was retired.  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  She had no physical limitations.  Echocardiogram was ordered and showed a perimembranous VSD with left-to-right shunting.  She remained stable no further testing was ordered.  She presents to the clinic today for follow-up evaluation and states she continues to be physically active walking or riding her stationary bike.  She  states she rides her stationary bike about 45 minutes at a time.  She denies chest pain with increased physical activity.  We reviewed her previous echocardiogram and she expressed understanding.  I will order a repeat echocardiogram to be done in the next 1 to 2 months, give the salty 6 diet sheet and plan follow-up in 12 months.  Today she denies chest pain, shortness of breath, lower extremity edema, fatigue, palpitations, melena, hematuria, hemoptysis, diaphoresis, weakness, presyncope, syncope, orthopnea, and PND.     Home Medications    Prior to Admission medications   Medication Sig Start Date End Date Taking? Authorizing Provider  aspirin EC 81 MG tablet Take 81 mg by mouth daily.    [provider]  clobetasol ointment (TEMOVATE) 0.05 % Apply 1 Application topically 2 (two) times a week. Will increase the vulvar applications up to daily if symptoms of itching recur. 08/13/22   Genia Del, MD  Cyanocobalamin (VITAMIN B12 PO) Take 1 tablet by mouth daily.    [provider]  Ferrous Sulfate (IRON PO) Take by mouth daily.    [provider]  FIBER PO Take by mouth.    [provider]  losartan (COZAAR) 25 MG tablet Take 25 mg by mouth at bedtime. 05/30/18   [provider]  metFORMIN (GLUCOPHAGE) 500 MG tablet Take 500 mg by mouth 2 (two) times daily with a meal.    [provider]  Multiple Vitamin (MULITIVITAMIN WITH MINERALS) TABS Take 1 tablet by mouth daily.    [provider]  Omega-3 Fatty Acids (FISH OIL) 1000 MG CAPS Take by mouth.    [provider]  simvastatin (ZOCOR) 10 MG tablet Take 10 mg by mouth daily. 05/30/18   [provider]    Family History    Family History  Problem Relation Age of Onset   Breast cancer Mother        diagnosed in her 78's   Breast cancer Maternal Aunt    Cancer Cousin        uterine   Heart attack Father    Cancer Maternal Aunt        uterine   Diabetes  Maternal Aunt    She indicated that her mother is deceased. She indicated that her father is deceased. She indicated that her maternal grandmother is deceased. She indicated that her maternal grandfather is deceased. She indicated that her paternal grandmother is deceased. She indicated that her paternal grandfather is deceased. She indicated that all of her three maternal aunts are deceased. She indicated that the status of her cousin is unknown.  Social History    Social History   Socioeconomic History   Marital status: Single    Spouse name: Not on file   Number of children: Not on file   Years of education: Not on file   Highest education level: Not on file  Occupational History   Not on file  Tobacco Use   Smoking status: Never   Smokeless tobacco: Never  Vaping Use   Vaping Use: Never used  Substance and Sexual Activity   Alcohol use: No   Drug use: No   Sexual activity: Not Currently    Partners: Male    Birth control/protection: Post-menopausal    Comment: 1st intercourse- 21, partners- 4,   Other Topics Concern   Not on file  Social History Narrative   Not on file   Social Determinants of Health   Financial Resource Strain: Not on file  Food Insecurity: Not on file  Transportation Needs: Not on file  Physical Activity: Not on file  Stress: Not on file  Social Connections: Not on file  Intimate Partner Violence: Not on file     Review of Systems    General:  No chills, fever, night sweats or weight changes.  Cardiovascular:  No chest pain, dyspnea on exertion, edema, orthopnea, palpitations, paroxysmal nocturnal dyspnea. Dermatological: No rash, lesions/masses Respiratory: No cough, dyspnea Urologic: No hematuria, dysuria Abdominal:   No nausea, vomiting, diarrhea, bright red blood per rectum, melena, or hematemesis Neurologic:  No visual changes, wkns, changes in mental status. All other systems reviewed and are otherwise negative except as noted  above.  Physical Exam    VS:  BP 120/76   Pulse 66   Ht 5' 6.5" (1.689 m)   Wt 175 lb 3.2 oz (79.5 kg)   SpO2 99%   BMI 27.85 kg/m  , BMI Body mass index is 27.85 kg/m. GEN: Well nourished, well developed, in no acute distress. HEENT: normal. Neck: Supple, no JVD, carotid bruits, or masses. Cardiac: RRR, 3/6 systolic murmur heard along left sternal border, rubs, or gallops. No clubbing, cyanosis, edema.  Radials/DP/PT 2+ and equal bilaterally.  Respiratory:  Respirations regular and unlabored, clear to auscultation bilaterally. GI: Soft, nontender, nondistended, BS + x 4. MS: no deformity or atrophy. Skin: warm and dry, no rash. Neuro:  Strength  and sensation are intact. Psych: Normal affect.  Accessory Clinical Findings    Recent Labs: No results found for requested labs within last 365 days.   Recent Lipid Panel No results found for: "CHOL", "TRIG", "HDL", "CHOLHDL", "VLDL", "LDLCALC", "LDLDIRECT"       ECG personally reviewed by me today-normal sinus rhythm low voltage QRS 66 bpm- No acute changes  Echocardiogram 06/23/2018  Study Conclusions   - Left ventricle: The cavity size was normal. Wall thickness was    increased in a pattern of mild LVH. Systolic function was normal.    The estimated ejection fraction was in the range of 60% to 65%.    Wall motion was normal; there were no regional wall motion    abnormalities. Left ventricular diastolic function parameters    were normal.  - Atrial septum: No defect or patent foramen ovale was identified.  - Pulmonary arteries: PA peak pressure: 41 mm Hg (S).  - Impressions: Patient has a peri membranous VSD with left to right    shunting. Appears a bit larger than would typically label as    restrictive    Consider f/u right heart cath and shunt run to quantitate Qp/Qs    if clinically indicated   Impressions:   - Patient has a peri membranous VSD with left to right shunting.    Appears a bit larger than would  typically label as restrictive    Consider f/u right heart cath and shunt run to quantitate Qp/Qs    if clinically indicated   -------------------------------------------------------------------  Study data:  No prior study was available for comparison.  Study  status:  Routine.  Procedure:  The patient reported no pain pre or  post test. Transthoracic echocardiography for left ventricular  function evaluation. Image quality was adequate.  Study completion:   There were no complications.          Transthoracic  echocardiography.  M-mode, complete 2D, spectral Doppler, and color  Doppler.  Birthdate:  Patient birthdate: 09-Jun-1951.  Age:  Patient  is 72 yr old.  Sex:  Gender: female.    BMI: 34.3 kg/m^2.  Blood  pressure:     136/88  Patient status:  Outpatient.  Study date:  Study date: 06/23/2018. Study time: 07:46 AM.  Location:  [facility]   -------------------------------------------------------------------   -------------------------------------------------------------------  Left ventricle:  The cavity size was normal. Wall thickness was  increased in a pattern of mild LVH. Systolic function was normal.  The estimated ejection fraction was in the range of 60% to 65%.  Wall motion was normal; there were no regional wall motion  abnormalities. The transmitral flow pattern was normal. The  deceleration time of the early transmitral flow velocity was  normal. The pulmonary vein flow pattern was normal. The tissue  Doppler parameters were normal. Left ventricular diastolic function  parameters were normal.   -------------------------------------------------------------------  Aortic valve:   Trileaflet.  Doppler:   There was no stenosis.    -------------------------------------------------------------------  Aorta: The aorta was normal, not dilated, and non-diseased.   -------------------------------------------------------------------  Mitral valve:   Structurally  normal valve.   Leaflet separation was  normal.  Doppler:  Transvalvular velocity was within the normal  range. There was no evidence for stenosis. There was no significant  regurgitation.    Peak gradient (D): 3 mm Hg.   -------------------------------------------------------------------  Left atrium:  The atrium was normal in size.   -------------------------------------------------------------------  Atrial septum:  No defect or  patent foramen ovale was identified.    -------------------------------------------------------------------  Right ventricle:  The cavity size was normal. Wall thickness was  normal. Systolic function was normal.   -------------------------------------------------------------------  Pulmonic valve:    Doppler:  There was mild regurgitation.   -------------------------------------------------------------------  Tricuspid valve:   Doppler:  There was mild regurgitation.   -------------------------------------------------------------------  Right atrium:  The atrium was normal in size.   -------------------------------------------------------------------  Pericardium: The pericardium was normal in appearance.   -------------------------------------------------------------------  Systemic veins:  Inferior vena cava: The vessel was normal in size. The  respirophasic diameter changes were in the normal range (>= 50%),  consistent with normal central venous pressure.   -------------------------------------------------------------------  Post procedure conclusions  Ascending Aorta:   - The aorta was normal, not dilated, and non-diseased.    Assessment & Plan   1.  VSD-continues to be fairly physically active.  Continues to care for her autistic brother.  Echocardiogram 2019 showed membranous VSD. Repeat echocardiogram  Hyperlipidemia-LDL98 on 05/01/23.  Goal less than 70. Continue current medical therapy High-fiber diet  Essential hypertension-BP  today 120/76. Maintain blood pressure log Continue losartan  Preoperative cardiac evaluation-  Primary Cardiologist: Dr. Katrinka Blazing.  Colonoscopy, 08/21/2023, Dr. Audley Hose, [facility], fax #[phone]  Chart reviewed as part of pre-operative protocol coverage. Given past medical history and time since last visit, based on ACC/AHA guidelines, Karla Mullen for the planned procedure without further cardiovascular testing.   Patient was advised that if she develops new symptoms prior to surgery to contact our office to arrange a follow-up appointment.  She verbalized understanding.  I will route this recommendation to the requesting party via Epic fax function and remove from pre-op pool.   Disposition: Follow-up in 1 year with Dr. Excell Seltzer or me.   Thomasene Ripple. Kooper Godshall NP-C     05/07/2023, 9:24 AM  [facility] [address] Office [phone] Fax [phone]    I spent 14 minutes examining this patient, reviewing medications, and using patient centered shared decision making involving her cardiac care.  Prior to her visit I spent greater than 20 minutes reviewing her past medical history,  medications, and prior cardiac tests.

## 2023-05-07 ENCOUNTER — Ambulatory Visit: Payer: Medicare HMO | Attending: Nurse Practitioner | Admitting: General Practice

## 2023-05-07 ENCOUNTER — Encounter: Payer: Self-pay | Admitting: General Practice

## 2023-05-07 VITALS — BP 120/76 | HR 66 | Ht 66.5 in | Wt 175.2 lb

## 2023-05-07 DIAGNOSIS — Q210 Ventricular septal defect: Secondary | ICD-10-CM

## 2023-05-07 DIAGNOSIS — I10 Essential (primary) hypertension: Secondary | ICD-10-CM | POA: Diagnosis not present

## 2023-05-07 DIAGNOSIS — E782 Mixed hyperlipidemia: Secondary | ICD-10-CM | POA: Diagnosis not present

## 2023-05-07 DIAGNOSIS — Z01810 Encounter for preprocedural cardiovascular examination: Secondary | ICD-10-CM

## 2023-05-07 NOTE — Patient Instructions (Signed)
Medication Instructions:  The current medical regimen is effective;  continue present plan and medications as directed. Please refer to the Current Medication list given to you today.  *If you need a refill on your cardiac medications before your next appointment, please call your pharmacy*  Lab Work: NONE If you have labs (blood work) drawn today and your tests are completely normal, you will receive your results only by:  MyChart Message (if you have MyChart) OR A paper copy in the mail If you have any lab test that is abnormal or we need to change your treatment, we will call you to review the results.  Testing/Procedures: Echocardiogram - Your physician has requested that you have an echocardiogram. Echocardiography is a painless test that uses sound waves to create images of your heart. It provides your doctor with information about the size and shape of your heart and how well your heart's chambers and valves are working. This procedure takes approximately one hour. There are no restrictions for this procedure.   Follow-Up: At [facility], you and your health needs are our priority.  As part of our continuing mission to provide you with exceptional heart care, we have created designated Provider Care Teams.  These Care Teams include your primary Cardiologist (physician) and Advanced Practice Providers (APPs -  Physician Assistants and Nurse Practitioners) who all work together to provide you with the care you need, when you need it.  Your next appointment:   12 month(s)  Provider:   Tonny Bollman, MD     Other Instructions OK FOR COLONOSCOPY  MAINTAIN PHYSICAL ACTIVITY  PLEASE READ AND FOLLOW ATTACHED  SALTY 6

## 2023-05-08 DIAGNOSIS — I10 Essential (primary) hypertension: Secondary | ICD-10-CM | POA: Diagnosis not present

## 2023-05-08 DIAGNOSIS — Z23 Encounter for immunization: Secondary | ICD-10-CM | POA: Diagnosis not present

## 2023-05-08 DIAGNOSIS — Z0000 Encounter for general adult medical examination without abnormal findings: Secondary | ICD-10-CM | POA: Diagnosis not present

## 2023-05-08 DIAGNOSIS — E785 Hyperlipidemia, unspecified: Secondary | ICD-10-CM | POA: Diagnosis not present

## 2023-05-08 DIAGNOSIS — D508 Other iron deficiency anemias: Secondary | ICD-10-CM | POA: Diagnosis not present

## 2023-05-08 DIAGNOSIS — E538 Deficiency of other specified B group vitamins: Secondary | ICD-10-CM | POA: Diagnosis not present

## 2023-05-08 DIAGNOSIS — E1165 Type 2 diabetes mellitus with hyperglycemia: Secondary | ICD-10-CM | POA: Diagnosis not present

## 2023-05-21 DIAGNOSIS — Z1211 Encounter for screening for malignant neoplasm of colon: Secondary | ICD-10-CM | POA: Diagnosis not present

## 2023-05-21 DIAGNOSIS — Z86010 Personal history of colonic polyps: Secondary | ICD-10-CM | POA: Diagnosis not present

## 2023-05-21 DIAGNOSIS — Z09 Encounter for follow-up examination after completed treatment for conditions other than malignant neoplasm: Secondary | ICD-10-CM | POA: Diagnosis not present

## 2023-05-21 DIAGNOSIS — K562 Volvulus: Secondary | ICD-10-CM | POA: Diagnosis not present

## 2023-05-21 LAB — HM COLONOSCOPY

## 2023-05-23 ENCOUNTER — Other Ambulatory Visit: Payer: Self-pay | Admitting: Obstetrics & Gynecology

## 2023-05-23 DIAGNOSIS — Z1231 Encounter for screening mammogram for malignant neoplasm of breast: Secondary | ICD-10-CM

## 2023-06-04 ENCOUNTER — Other Ambulatory Visit (HOSPITAL_COMMUNITY): Payer: Medicare HMO

## 2023-06-12 ENCOUNTER — Ambulatory Visit (HOSPITAL_COMMUNITY): Payer: Medicare HMO | Attending: Cardiology

## 2023-06-12 DIAGNOSIS — Q210 Ventricular septal defect: Secondary | ICD-10-CM | POA: Insufficient documentation

## 2023-06-12 LAB — ECHOCARDIOGRAM COMPLETE
Area-P 1/2: 2.71 cm2
MV M vel: 5.17 m/s
MV Peak grad: 106.9 mmHg
S' Lateral: 2.6 cm

## 2023-06-19 ENCOUNTER — Ambulatory Visit
Admission: RE | Admit: 2023-06-19 | Discharge: 2023-06-19 | Disposition: A | Discharge status: Home / Self Care | Payer: Medicare HMO | Source: Ambulatory Visit | Attending: Obstetrics & Gynecology | Admitting: Obstetrics & Gynecology

## 2023-06-19 DIAGNOSIS — Z1231 Encounter for screening mammogram for malignant neoplasm of breast: Secondary | ICD-10-CM | POA: Diagnosis not present

## 2023-08-15 ENCOUNTER — Ambulatory Visit: Payer: Medicare HMO | Admitting: Radiology

## 2023-11-01 DIAGNOSIS — E1165 Type 2 diabetes mellitus with hyperglycemia: Secondary | ICD-10-CM | POA: Diagnosis not present

## 2023-11-12 DIAGNOSIS — E1165 Type 2 diabetes mellitus with hyperglycemia: Secondary | ICD-10-CM | POA: Diagnosis not present

## 2023-11-12 DIAGNOSIS — Z23 Encounter for immunization: Secondary | ICD-10-CM | POA: Diagnosis not present

## 2023-11-12 DIAGNOSIS — I10 Essential (primary) hypertension: Secondary | ICD-10-CM | POA: Diagnosis not present

## 2023-11-12 DIAGNOSIS — E785 Hyperlipidemia, unspecified: Secondary | ICD-10-CM | POA: Diagnosis not present

## 2024-05-06 LAB — LAB REPORT - SCANNED
A1c: 5.7
EGFR (African American): 46
TSH: 1.24 (ref 0.41–5.90)

## 2024-08-14 ENCOUNTER — Other Ambulatory Visit: Payer: Self-pay | Admitting: Family Medicine

## 2024-08-14 DIAGNOSIS — Z1231 Encounter for screening mammogram for malignant neoplasm of breast: Secondary | ICD-10-CM

## 2024-09-09 ENCOUNTER — Ambulatory Visit

## 2024-09-24 ENCOUNTER — Ambulatory Visit: Payer: Medicare (Managed Care) | Attending: Cardiovascular Disease | Admitting: Cardiovascular Disease

## 2024-09-24 ENCOUNTER — Encounter: Payer: Self-pay | Admitting: Cardiovascular Disease

## 2024-09-24 VITALS — BP 124/70 | HR 62 | Ht 67.0 in | Wt 182.4 lb

## 2024-09-24 DIAGNOSIS — Z01812 Encounter for preprocedural laboratory examination: Secondary | ICD-10-CM | POA: Diagnosis not present

## 2024-09-24 DIAGNOSIS — I10 Essential (primary) hypertension: Secondary | ICD-10-CM

## 2024-09-24 DIAGNOSIS — Q210 Ventricular septal defect: Secondary | ICD-10-CM | POA: Diagnosis not present

## 2024-09-24 NOTE — Assessment & Plan Note (Signed)
 Patient likely has a restrictive VSD.  We went back and discussed her history and it 73 years old she was under the impression that she had surgery.  However, the only incision is at the left brachial site from a cutdown suggestive that she had a diagnostic heart catheterization.  She has no history of surgery involving the chest wall.  She likely has a restrictive VSD, which her exam suggests.  She is completely asymptomatic.  Her echo last year raise the question of whether she had a larger shunt and further evaluation with either cardiac catheterization or MRI was recommended.  I think the patient should have a cardiac MRI to better assess her anatomy and quantify her shunt.  I suspect that ongoing clinical follow-up will be recommended and I will plan to see her back in 1 year.

## 2024-09-24 NOTE — Progress Notes (Signed)
 Cardiology Office Note:    Date:  09/24/2024   ID:  Karla Mullen, DOB 09-18-1951, MRN [id number]  PCP:  Gerome Brunet, DO   [facility] Providers Cardiologist:  Ozell Fell, MD     Referring MD: Gerome Brunet, DO   Chief Complaint  Patient presents with   Follow-up    VSD    History of Present Illness:    Karla Mullen is a 73 y.o. female with a hx of restrictive perimembranous VSD, type 2 diabetes, mixed hyperlipidemia, and hypertension.  The patient reports that she has a longstanding heart murmur and has known of a VSD since childhood.  She underwent cardiac catheterization via left brachial cutdown at age 52.  She was under the impression that she had some sort of surgical repair of her VSD, but after careful review appears that she just had a catheterization performed.  She has had no thoracotomy or sternotomy in the past.  The patient denies any chest pain, chest pressure, or shortness of breath.  She is physically active with no complaints.  She played sports in her teenage years including basketball and track and had no functional limitation.  Her echocardiogram last year showed LVEF 65 to 70%, VSD felt to possibly be nonrestrictive, and QP: QS felt to not be reliable measuring greater than 1.5:1.  Right heart catheterization or cardiac MRI were recommended but not yet performed.   Current Medications: Current Meds  Medication Sig   aspirin EC 81 MG tablet Take 81 mg by mouth daily.   cholecalciferol (VITAMIN D3) 25 MCG (1000 UNIT) tablet Take 1,000 Units by mouth daily.   Cyanocobalamin  (VITAMIN B12 PO) Take 1 tablet by mouth daily.   Ferrous Sulfate (IRON PO) Take by mouth daily.   FIBER PO Take by mouth.   losartan (COZAAR) 25 MG tablet Take 25 mg by mouth at bedtime.   Magnesium 250 MG CAPS Take by mouth.   metFORMIN (GLUCOPHAGE) 500 MG tablet Take 500 mg by mouth 2 (two) times daily with a meal. (Patient taking differently: Take 500 mg by mouth daily with  breakfast.)   Multiple Vitamin (MULITIVITAMIN WITH MINERALS) TABS Take 1 tablet by mouth daily.   Omega-3 Fatty Acids (FISH OIL) 1000 MG CAPS Take by mouth.   simvastatin (ZOCOR) 10 MG tablet Take 10 mg by mouth daily.   vitamin E 200 UNIT capsule Take 200 Units by mouth daily.     Allergies:   Cetirizine and Pravastatin sodium   ROS:   Please see the history of present illness.    All other systems reviewed and are negative.  EKGs/Labs/Other Studies Reviewed:    The following studies were reviewed today: Cardiac Studies & Procedures   ______________________________________________________________________________________________     ECHOCARDIOGRAM  ECHOCARDIOGRAM COMPLETE 06/12/2023  Narrative ECHOCARDIOGRAM REPORT    Patient Name:   Karla Mullen   Date of Exam: 06/12/2023 Medical Rec #:  [id number]     Height:       66.5 in Accession #:    [id number]    Weight:       175.2 lb Date of Birth:  Apr 19, 1951     BSA:          1.901 m Patient Age:    71 years      BP:           120/76 mmHg Patient Gender: F             HR:  51 bpm. Exam Location:  [address]  Procedure: 2D Echo, 3D Echo, Cardiac Doppler and Color Doppler  Indications:    Q21.0 VSD  History:        Patient has prior history of Echocardiogram examinations, most recent 06/23/2018. Signs/Symptoms:Murmur; Risk Factors:Diabetes and Family History of Coronary Artery Disease. Perimembranous VSD status post Repair (age 70), with residual flow seen prior.  Sonographer:    Heather Hawks RDCS Referring Phys: [id number] JESSE M CLEAVER  IMPRESSIONS   1. A perimembranous VSD present. Qp:Qs not reliable on current study due to the presence of mild to moderate MR (calculated to be >1.5:1), but there is concern for a hemodynamically significant shunt and size of shunt appears greater than what would be considered restrictive. Consider RHC or CMR for further evaluation. This was noted on prior TTE in 2019 as well.  Left ventricular ejection fraction, by estimation, is 65 to 70%. Left ventricular ejection fraction by 3D volume is 70 %. The left ventricle has normal function. The left ventricle has no regional wall motion abnormalities. Left ventricular diastolic parameters were normal. 2. Right ventricular systolic function is normal. The right ventricular size is normal. There is mildly elevated pulmonary artery systolic pressure. The estimated right ventricular systolic pressure is 41.2 mmHg. 3. Left atrial size was moderately dilated. 4. The mitral valve is grossly normal. Mild mitral valve regurgitation. 5. The aortic valve is tricuspid. There is mild calcification of the aortic valve. There is mild thickening of the aortic valve. Aortic valve regurgitation is not visualized. Aortic valve sclerosis/calcification is present, without any evidence of aortic stenosis. 6. The inferior vena cava is normal in size with greater than 50% respiratory variability, suggesting right atrial pressure of 3 mmHg.  Comparison(s): Similar to prior TTE in 2019, suspect VSD is possibly non-restrictive. Recommend RHC or CMR for further evaluation if clinically indicated.  FINDINGS Left Ventricle: A perimembranous VSD present. Qp:Qs not reliable on current study due to the presence of mild to moderate MR (calculated to be >1.5:1), but there is concern for a hemodynamically significant shunt and size of shunt appears greater than what would be considered restrictive. Consider RHC or CMR for further evaluation. This was noted on prior TTE in 2019 as well. Left ventricular ejection fraction, by estimation, is 65 to 70%. Left ventricular ejection fraction by 3D volume is 70 %. The left ventricle has normal function. The left ventricle has no regional wall motion abnormalities. The left ventricular internal cavity size was normal in size. There is no left ventricular hypertrophy. Left ventricular diastolic parameters were  normal.  Right Ventricle: The right ventricular size is normal. No increase in right ventricular wall thickness. Right ventricular systolic function is normal. There is mildly elevated pulmonary artery systolic pressure. The tricuspid regurgitant velocity is 3.09 m/s, and with an assumed right atrial pressure of 3 mmHg, the estimated right ventricular systolic pressure is 41.2 mmHg.  Left Atrium: Left atrial size was moderately dilated.  Right Atrium: Right atrial size was normal in size.  Pericardium: There is no evidence of pericardial effusion.  Mitral Valve: The mitral valve is grossly normal. There is mild thickening of the mitral valve leaflet(s). Mild mitral valve regurgitation.  Tricuspid Valve: The tricuspid valve is normal in structure. Tricuspid valve regurgitation is mild.  Aortic Valve: The aortic valve is tricuspid. There is mild calcification of the aortic valve. There is mild thickening of the aortic valve. Aortic valve regurgitation is not visualized. Aortic valve sclerosis/calcification is present, without  any evidence of aortic stenosis.  Pulmonic Valve: The pulmonic valve was normal in structure. Pulmonic valve regurgitation is trivial.  Aorta: The aortic root and ascending aorta are structurally normal, with no evidence of dilitation.  Venous: The inferior vena cava is normal in size with greater than 50% respiratory variability, suggesting right atrial pressure of 3 mmHg.  IAS/Shunts: The atrial septum is grossly normal.   LEFT VENTRICLE PLAX 2D LVIDd:         3.95 cm         Diastology LVIDs:         2.60 cm         LV e' medial:    6.85 cm/s LV PW:         0.75 cm         LV E/e' medial:  11.7 LV IVS:        0.80 cm         LV e' lateral:   14.00 cm/s LVOT diam:     2.00 cm         LV E/e' lateral: 5.7 LV SV:         73 LV SV Index:   39 LVOT Area:     3.14 cm        3D Volume EF LV 3D EF:    Left ventricul ar ejection fraction by 3D volume is 70  %.  3D Volume EF: 3D EF:        70 % LV EDV:       166 ml LV ESV:       51 ml LV SV:        115 ml  RIGHT VENTRICLE RV Basal diam:  3.00 cm RV S prime:     11.80 cm/s TAPSE (M-mode): 2.3 cm RVSP:           46.2 mmHg  LEFT ATRIUM             Index        RIGHT ATRIUM           Index LA diam:        4.45 cm 2.34 cm/m   RA Pressure: 8.00 mmHg LA Vol (A2C):   98.2 ml 51.66 ml/m  RA Area:     16.10 cm LA Vol (A4C):   85.3 ml 44.87 ml/m  RA Volume:   44.00 ml  23.15 ml/m LA Biplane Vol: 95.3 ml 50.13 ml/m AORTIC VALVE LVOT Vmax:   102.75 cm/s LVOT Vmean:  64.350 cm/s LVOT VTI:    0.233 m  AORTA Ao Root diam: 3.00 cm Ao Asc diam:  3.20 cm  MITRAL VALVE               TRICUSPID VALVE MV Area (PHT): cm         TR Peak grad:   38.2 mmHg MV Decel Time: 280 msec    TR Vmax:        309.00 cm/s MR Peak grad: 106.9 mmHg   Estimated RAP:  8.00 mmHg MR Mean grad: 74.0 mmHg    RVSP:           46.2 mmHg MR Vmax:      517.00 cm/s MR Vmean:     408.0 cm/s   SHUNTS MV E velocity: 80.25 cm/s  Systemic VTI:  0.23 m MV A velocity: 73.45 cm/s  Systemic Diam: 2.00 cm MV E/A ratio:  1.09  Powell Sorrow MD Electronically signed by  Powell Sorrow MD Signature Date/Time: 06/12/2023/11:36:42 AM    Final          ______________________________________________________________________________________________      EKG:   EKG Interpretation Date/Time:  Thursday September 24 2024 09:13:49 EDT Ventricular Rate:  54 PR Interval:  156 QRS Duration:  86 QT Interval:  422 QTC Calculation: 400 R Axis:   -12  Text Interpretation: Sinus bradycardia Low voltage QRS No previous ECGs available Confirmed by Wonda Sharper [phone]) on 09/24/2024 9:24:23 AM    Recent Labs: 05/06/2024: TSH 1.24  Recent Lipid Panel No results found for: CHOL, TRIG, HDL, CHOLHDL, VLDL, LDLCALC, LDLDIRECT   Risk Assessment/Calculations:                Physical Exam:    VS:  BP 124/70  (BP Location: Right Arm, Patient Position: Sitting, Cuff Size: Normal)   Pulse 62   Ht 5' 7 (1.702 m)   Wt 182 lb 6.4 oz (82.7 kg)   BMI 28.57 kg/m     Wt Readings from Last 3 Encounters:  09/24/24 182 lb 6.4 oz (82.7 kg)  05/07/23 175 lb 3.2 oz (79.5 kg)  04/30/23 170 lb (77.1 kg)     GEN:  Well nourished, well developed in no acute distress HEENT: Normal NECK: No JVD; No carotid bruits LYMPHATICS: No lymphadenopathy CARDIAC: RRR, 3-4/6 pansystolic murmur loudest along the LSB RESPIRATORY:  Clear to auscultation without rales, wheezing or rhonchi  ABDOMEN: Soft, non-tender, non-distended MUSCULOSKELETAL:  No edema; No deformity  SKIN: Warm and dry NEUROLOGIC:  Alert and oriented x 3 PSYCHIATRIC:  Normal affect   Assessment & Plan VSD (ventricular septal defect) Patient likely has a restrictive VSD.  We went back and discussed her history and it 73 years old she was under the impression that she had surgery.  However, the only incision is at the left brachial site from a cutdown suggestive that she had a diagnostic heart catheterization.  She has no history of surgery involving the chest wall.  She likely has a restrictive VSD, which her exam suggests.  She is completely asymptomatic.  Her echo last year raise the question of whether she had a larger shunt and further evaluation with either cardiac catheterization or MRI was recommended.  I think the patient should have a cardiac MRI to better assess her anatomy and quantify her shunt.  I suspect that ongoing clinical follow-up will be recommended and I will plan to see her back in 1 year. Essential hypertension Blood pressure controlled on the low-dose of losartan Pre-procedure lab exam             Medication Adjustments/Labs and Tests Ordered: Current medicines are reviewed at length with the patient today.  Concerns regarding medicines are outlined above.  Orders Placed This Encounter  Procedures   EKG 12-Lead   No  orders of the defined types were placed in this encounter.   There are no Patient Instructions on file for this visit.   Signed, Sharper Wonda, MD  09/24/2024 9:41 AM    [facility]

## 2024-09-24 NOTE — Patient Instructions (Addendum)
 Medication Instructions:  No medication changes were made at this visit. Continue current regimen.   *If you need a refill on your cardiac medications before your next appointment, please call your pharmacy*  Lab Work: To be completed 1 week prior to cardiac MRI: Hemoglobin/ hematocrit   If you have labs (blood work) drawn today and your tests are completely normal, you will receive your results only by: MyChart Message (if you have MyChart) OR A paper copy in the mail If you have any lab test that is abnormal or we need to change your treatment, we will call you to review the results.  Testing/Procedures: Your provider has requested that you have a cardiac MRI.  Follow-Up: At [facility], you and your health needs are our priority.  As part of our continuing mission to provide you with exceptional heart care, our providers are all part of one team.  This team includes your primary Cardiologist (physician) and Advanced Practice Providers or APPs (Physician Assistants and Nurse Practitioners) who all work together to provide you with the care you need, when you need it.  Your next appointment:   1 year(s)  Provider:   Ozell Fell, MD    We recommend signing up for the patient portal called MyChart.  Sign up information is provided on this After Visit Summary.  MyChart is used to connect with patients for Virtual Visits (Telemedicine).  Patients are able to view lab/test results, encounter notes, upcoming appointments, etc.  Non-urgent messages can be sent to your provider as well.   To learn more about what you can do with MyChart, go to [email or web address].   Other Instructions    Cardiac MRI - Please arrive 30-45 minutes prior to test start time. ?   [facility] [address] [place], [place] [address] [phone] Please take advantage of the free valet parking available at the [facility] and [employer] (Entrance C).  Proceed to the [facility] Radiology Department (First Floor) for check-in.    Magnetic resonance imaging (MRI) is a painless test that produces images of the inside of the body without using Xrays.   During an MRI, strong magnets and radio waves work together in a Data processing manager to form detailed images.    MRI images may provide more details about a medical condition than X-rays, CT scans, and ultrasounds can provide.   You may be given earphones to listen for instructions.   You may eat a light breakfast and take medications as ordered with the exception of furosemide, hydrochlorothiazide, or spironolactone(fluid pill, other). Please avoid stimulants for 12 hr prior to test. (Ie. Caffeine, nicotine, chocolate, or antihistamine medications)   If a contrast material will be used, an IV will be inserted into one of your veins. Contrast material will be injected into your IV. It will leave your body through your urine within a day. You may be told to drink plenty of fluids to help flush the contrast material out of your system.   You will be asked to remove all metal, including: Watch, jewelry, and other metal objects including hearing aids, hair pieces and dentures. Also wearable glucose monitoring systems (ie. Freestyle Libre and Omnipods) (Braces and fillings normally are not a problem.)   TEST WILL TAKE APPROXIMATELY 1 HOUR   PLEASE NOTIFY SCHEDULING AT LEAST 24 HOURS IN ADVANCE IF YOU ARE UNABLE TO KEEP YOUR APPOINTMENT. [phone]   For more information and frequently asked questions, please visit our website : [email or web address]  Please call Camie Shutter, cardiac imaging nurse navigator with any questions/concerns. Camie Shutter RN Navigator Cardiac Imaging Chantal Requena RN Navigator Cardiac Imaging [facility] [phone] Office

## 2024-09-30 LAB — HEMOGLOBIN AND HEMATOCRIT, BLOOD
Hematocrit: 31.8 % — ABNORMAL LOW (ref 34.0–46.6)
Hemoglobin: 9.9 g/dL — ABNORMAL LOW (ref 11.1–15.9)

## 2024-10-02 ENCOUNTER — Ambulatory Visit
Admission: RE | Admit: 2024-10-02 | Discharge: 2024-10-02 | Disposition: A | Discharge status: Home / Self Care | Payer: Medicare (Managed Care) | Source: Ambulatory Visit | Attending: Family Medicine | Admitting: Family Medicine

## 2024-10-02 DIAGNOSIS — Z1231 Encounter for screening mammogram for malignant neoplasm of breast: Secondary | ICD-10-CM

## 2024-10-14 ENCOUNTER — Other Ambulatory Visit: Payer: Self-pay | Admitting: Cardiovascular Disease

## 2024-10-14 ENCOUNTER — Ambulatory Visit (HOSPITAL_COMMUNITY)
Admission: RE | Admit: 2024-10-14 | Discharge: 2024-10-14 | Disposition: A | Discharge status: Home / Self Care | Payer: Medicare (Managed Care) | Source: Ambulatory Visit | Attending: Cardiovascular Disease | Admitting: Cardiovascular Disease

## 2024-10-14 DIAGNOSIS — Q210 Ventricular septal defect: Secondary | ICD-10-CM | POA: Insufficient documentation

## 2024-10-14 MED ORDER — GADOBUTROL 1 MMOL/ML IV SOLN
11.0000 mL | Freq: Once | INTRAVENOUS | Status: AC | PRN
  Administered 2024-10-14: 11 mL via INTRAVENOUS

## 2024-10-15 ENCOUNTER — Ambulatory Visit: Payer: Self-pay | Admitting: Physician Assistant
